# Patient Record
Sex: Female | Born: 1955
Health system: Southern US, Community
[De-identification: ages and names within clinical notes are randomized; demographics above are authoritative.]

## PROBLEM LIST (undated history)

## (undated) HISTORY — PX: NO PAST SURGERIES: SHX2092

---

## 2005-08-11 ENCOUNTER — Emergency Department (HOSPITAL_COMMUNITY): Admission: EM | Admit: 2005-08-11 | Discharge: 2005-08-11 | Payer: Self-pay | Admitting: Family Medicine

## 2007-07-17 ENCOUNTER — Encounter
Admission: RE | Admit: 2007-07-17 | Discharge: 2007-07-17 | Payer: Self-pay | Admitting: Physical Medicine and Rehabilitation

## 2012-04-27 ENCOUNTER — Encounter (HOSPITAL_COMMUNITY): Payer: Self-pay | Admitting: Emergency Medicine

## 2012-04-27 ENCOUNTER — Emergency Department (HOSPITAL_COMMUNITY)
Admission: EM | Admit: 2012-04-27 | Discharge: 2012-04-27 | Disposition: A | Discharge status: Home / Self Care | Payer: PRIVATE HEALTH INSURANCE | Source: Home / Self Care

## 2012-04-27 DIAGNOSIS — M6580 Other synovitis and tenosynovitis, unspecified site: Secondary | ICD-10-CM

## 2012-04-27 DIAGNOSIS — M76891 Other specified enthesopathies of right lower limb, excluding foot: Secondary | ICD-10-CM

## 2012-04-27 NOTE — ED Provider Notes (Signed)
History     CSN: [id number]  Arrival date & time 04/27/12  1114   None     Chief Complaint  Patient presents with  . Knee Pain    right knee pain x 1 wk. denies injury.     (Consider location/radiation/quality/duration/timing/severity/associated sxs/prior treatment) HPI Comments: 56 year old lady is accompanied by her daughter and presents with a complaint of right knee pain for one week. There is no history of trauma or known injury. The pain is located primarily in the anteromedial aspect of the left knee. It has progressed over the past week. There is mild puffiness in this area. There is no pain or tenderness to the bony prominences of the knee. And she is able to ambulate with full weightbearing and demonstrate full range of motion. Denies other injuries. Denies history of pain in the knee or other joints. She states the pain is primarily felt after sitting for a period of time and then standing, the knee is stiff and painful but after walking a few steps it gets better. She describes her job as one that she has to stand for several hours during the day on a daily basis.   History reviewed. No pertinent past medical history.  History reviewed. No pertinent past surgical history.  History reviewed. No pertinent family history.  History  Substance Use Topics  . Smoking status: Never Smoker   . Smokeless tobacco: Not on file  . Alcohol Use: No    OB History    Grav Para Term Preterm Abortions TAB SAB Ect Mult Living                  Review of Systems  Constitutional: Negative for fever, chills and activity change.  HENT: Negative.   Respiratory: Negative.   Cardiovascular: Negative.   Musculoskeletal:       As per HPI  Skin: Negative for color change, pallor and rash.  Neurological: Negative.     Allergies  Review of patient's allergies indicates no known allergies.  Home Medications  No current outpatient prescriptions on file.  BP 121/80  Pulse 71  Temp  98.4 F (36.9 C) (Oral)  Resp 16  SpO2 100%  Physical Exam  Nursing note and vitals reviewed. Constitutional: She is oriented to person, place, and time. She appears well-developed and well-nourished. No distress.  HENT:  Head: Normocephalic and atraumatic.  Eyes: EOM are normal. Pupils are equal, round, and reactive to light.  Neck: Normal range of motion. Neck supple.  Musculoskeletal:       Mild tenderness in the medial aspect of the right knee. There is no deformity or bony tenderness. Tenderness to the tendons in the medial aspect is present. No discoloration. She is full, complete range of motion. Negative drawer. Negative varus and negative valgus. No laxity is appreciated. Distal neurovascular motor sensory is intact.  Lymphadenopathy:    She has no cervical adenopathy.  Neurological: She is alert and oriented to person, place, and time. No cranial nerve deficit.  Skin: Skin is warm and dry.  Psychiatric: She has a normal mood and affect.    ED Course  Procedures (including critical care time)  Labs Reviewed - No data to display No results found.   1. Tendinitis of right knee       MDM  I suspect prolonged standing is contributing to the discomfort in her right knee. There is no history of trauma and no evidence of internal knee derangement or injury. We will  apply a short knee immobilizer for her to wear primarily at work. Since she has been applying heat and that helps I would recommend that while working she applied a ThermaCare wraps under the knee immobilizer. We also discussed methods of rehabilitation of the knee with sitting and performing extension and flexion exercises and turning the ankle inward and outward and other such maneuvers. She may also take Aleve or Advil one or the other of her choice for discomfort. For any worsening new symptoms problems or followup with your doctor or may return.         Hayden Rasmussen, NP 04/27/12 1210

## 2012-04-27 NOTE — ED Notes (Signed)
Pt c/o right knee pain x 1 wk. Pt denies injury. Pain is a constant pain that varies.  Pt has used massage oil and heating pad along with aleve with no relief in symptoms.

## 2012-04-29 NOTE — ED Provider Notes (Signed)
Medical screening examination/treatment/procedure(s) were performed by resident physician or non-physician practitioner and as supervising physician I was immediately available for consultation/collaboration.   Barkley Bruns MD.    Linna Hoff, MD 04/29/12 2053

## 2014-03-13 ENCOUNTER — Encounter (HOSPITAL_COMMUNITY): Payer: Self-pay | Admitting: Emergency Medicine

## 2014-03-13 ENCOUNTER — Emergency Department (INDEPENDENT_AMBULATORY_CARE_PROVIDER_SITE_OTHER)
Admission: EM | Admit: 2014-03-13 | Discharge: 2014-03-13 | Disposition: A | Discharge status: Home / Self Care | Payer: Self-pay | Source: Home / Self Care | Attending: Emergency Medicine | Admitting: Emergency Medicine

## 2014-03-13 DIAGNOSIS — S134XXA Sprain of ligaments of cervical spine, initial encounter: Secondary | ICD-10-CM

## 2014-03-13 DIAGNOSIS — S39012A Strain of muscle, fascia and tendon of lower back, initial encounter: Secondary | ICD-10-CM

## 2014-03-13 MED ORDER — MELOXICAM 7.5 MG PO TABS: 7.5000 mg | ORAL_TABLET | Freq: Every day | ORAL | Status: DC

## 2014-03-13 MED ORDER — CYCLOBENZAPRINE HCL 5 MG PO TABS: 5.0000 mg | ORAL_TABLET | Freq: Every evening | ORAL | Status: DC | PRN
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
## 2014-11-26 ENCOUNTER — Encounter (HOSPITAL_COMMUNITY): Payer: Self-pay | Admitting: *Deleted

## 2014-11-26 ENCOUNTER — Emergency Department (INDEPENDENT_AMBULATORY_CARE_PROVIDER_SITE_OTHER)
Admission: EM | Admit: 2014-11-26 | Discharge: 2014-11-26 | Disposition: A | Discharge status: Home / Self Care | Payer: PRIVATE HEALTH INSURANCE | Source: Home / Self Care | Attending: Family Medicine | Admitting: Family Medicine

## 2014-11-26 DIAGNOSIS — L250 Unspecified contact dermatitis due to cosmetics: Secondary | ICD-10-CM

## 2014-11-26 MED ORDER — FLUTICASONE PROPIONATE 0.05 % EX CREA: TOPICAL_CREAM | Freq: Two times a day (BID) | CUTANEOUS | Status: DC

## 2014-11-26 MED ORDER — TRIAMCINOLONE ACETONIDE 40 MG/ML IJ SUSP
40.0000 mg | Freq: Once | INTRAMUSCULAR | Status: AC
  Administered 2014-11-26: 40 mg via INTRAMUSCULAR

## 2014-11-26 MED ORDER — TRIAMCINOLONE ACETONIDE 40 MG/ML IJ SUSP
INTRAMUSCULAR | Status: AC
  Filled 2014-11-26: qty 1

## 2014-11-26 NOTE — ED Provider Notes (Signed)
CSN: [id number]     Arrival date & time 11/26/14  1523 History   First MD Initiated Contact with Patient 11/26/14 1726     Chief Complaint  Patient presents with  . Rash   (Consider location/radiation/quality/duration/timing/severity/associated sxs/prior Treatment) Patient is a 59 y.o. female presenting with rash. The history is provided by the patient.  Rash Location:  Face and shoulder/arm Facial rash location:  Forehead, L cheek and chin Shoulder/arm rash location:  R forearm Quality: itchiness   Severity:  Mild Onset quality:  Sudden Duration:  3 days Progression:  Unchanged Chronicity:  New Context comment:  Unknown exposure Relieved by:  None tried Worsened by:  Nothing tried   History reviewed. No pertinent past medical history. History reviewed. No pertinent past surgical history. History reviewed. No pertinent family history. History  Substance Use Topics  . Smoking status: Never Smoker   . Smokeless tobacco: Not on file  . Alcohol Use: No   OB History    No data available     Review of Systems  Constitutional: Negative.   Skin: Positive for rash.    Allergies  Review of patient's allergies indicates no known allergies.  Home Medications   Prior to Admission medications   Medication Sig Start Date End Date Taking? Authorizing Provider  cyclobenzaprine (FLEXERIL) 5 MG tablet Take 1 tablet (5 mg total) by mouth at bedtime as needed for muscle spasms. 03/13/14   Charm Rings, MD  fluticasone (CUTIVATE) 0.05 % cream Apply topically 2 (two) times daily. 11/26/14   Linna Hoff, MD  meloxicam (MOBIC) 7.5 MG tablet Take 1 tablet (7.5 mg total) by mouth daily. 03/13/14   Charm Rings, MD   BP 114/72 mmHg  Pulse 66  Temp(Src) 98 F (36.7 C) (Oral)  Resp 12  SpO2 100% Physical Exam  Constitutional: She is oriented to person, place, and time. She appears well-developed and well-nourished. No distress.  Pulmonary/Chest: Effort normal and breath sounds normal.   Lymphadenopathy:    She has no cervical adenopathy.  Neurological: She is alert and oriented to person, place, and time.  Skin: Skin is warm and dry. Rash noted. There is erythema.  Patchy erythema pruritic dermatitis as noted, nontender, no sign of infection  Nursing note and vitals reviewed.   ED Course  Procedures (including critical care time) Labs Review Labs Reviewed - No data to display  Imaging Review No results found.   MDM   1. Contact dermatitis due to cosmetics        Linna Hoff, MD 11/26/14 1755

## 2014-11-26 NOTE — ED Notes (Signed)
Rash    On  Face  And  Arms           denys  Any   specefic   Known  Causative    Agents   Worse  On face

## 2019-02-19 ENCOUNTER — Other Ambulatory Visit: Payer: Self-pay

## 2019-02-19 ENCOUNTER — Ambulatory Visit: Payer: Commercial Managed Care - PPO | Admitting: Family Medicine

## 2019-02-19 ENCOUNTER — Encounter: Payer: Self-pay | Admitting: Family Medicine

## 2019-02-19 VITALS — BP 112/64 | HR 72 | Temp 97.7°F | Ht 59.0 in | Wt 119.8 lb

## 2019-02-19 DIAGNOSIS — Z13220 Encounter for screening for lipoid disorders: Secondary | ICD-10-CM | POA: Diagnosis not present

## 2019-02-19 DIAGNOSIS — Z0000 Encounter for general adult medical examination without abnormal findings: Secondary | ICD-10-CM

## 2019-02-19 DIAGNOSIS — Z1211 Encounter for screening for malignant neoplasm of colon: Secondary | ICD-10-CM | POA: Diagnosis not present

## 2019-02-19 DIAGNOSIS — R002 Palpitations: Secondary | ICD-10-CM

## 2019-02-19 DIAGNOSIS — Z1231 Encounter for screening mammogram for malignant neoplasm of breast: Secondary | ICD-10-CM

## 2019-02-19 DIAGNOSIS — Z1159 Encounter for screening for other viral diseases: Secondary | ICD-10-CM

## 2019-02-19 DIAGNOSIS — M25561 Pain in right knee: Secondary | ICD-10-CM

## 2019-02-19 DIAGNOSIS — Z114 Encounter for screening for human immunodeficiency virus [HIV]: Secondary | ICD-10-CM

## 2019-02-19 DIAGNOSIS — Z124 Encounter for screening for malignant neoplasm of cervix: Secondary | ICD-10-CM

## 2019-02-19 NOTE — Patient Instructions (Addendum)
For your knee, always wear supportive shoes Can take ibuprofen 2-3 tablets every 8 hours Consider wearing a slip on knee brace while standing for prolonged periods of time If no improvement, can follow up with Dr. Lorelei Pont (sports medicine in this office) You will get a call about mammogram and colonoscopy  Health Maintenance for Postmenopausal Women Menopause is a normal process in which your ability to get pregnant comes to an end. This process happens slowly over many months or years, usually between the ages of 20 and 41. Menopause is complete when you have missed your menstrual periods for 12 months. It is important to talk with your health care provider about some of the most common conditions that affect women after menopause (postmenopausal women). These include heart disease, cancer, and bone loss (osteoporosis). Adopting a healthy lifestyle and getting preventive care can help to promote your health and wellness. The actions you take can also lower your chances of developing some of these common conditions. What should I know about menopause? During menopause, you may get a number of symptoms, such as:  Hot flashes. These can be moderate or severe.  Night sweats.  Decrease in sex drive.  Mood swings.  Headaches.  Tiredness.  Irritability.  Memory problems.  Insomnia. Choosing to treat or not to treat these symptoms is a decision that you make with your health care provider. Do I need hormone replacement therapy?  Hormone replacement therapy is effective in treating symptoms that are caused by menopause, such as hot flashes and night sweats.  Hormone replacement carries certain risks, especially as you become older. If you are thinking about using estrogen or estrogen with progestin, discuss the benefits and risks with your health care provider. What is my risk for heart disease and stroke? The risk of heart disease, heart attack, and stroke increases as you age. One of  the causes may be a change in the body's hormones during menopause. This can affect how your body uses dietary fats, triglycerides, and cholesterol. Heart attack and stroke are medical emergencies. There are many things that you can do to help prevent heart disease and stroke. Watch your blood pressure  High blood pressure causes heart disease and increases the risk of stroke. This is more likely to develop in people who have high blood pressure readings, are of African descent, or are overweight.  Have your blood pressure checked: ? Every 3-5 years if you are 35-53 years of age. ? Every year if you are 81 years old or older. Eat a healthy diet   Eat a diet that includes plenty of vegetables, fruits, low-fat dairy products, and lean protein.  Do not eat a lot of foods that are high in solid fats, added sugars, or sodium. Get regular exercise Get regular exercise. This is one of the most important things you can do for your health. Most adults should:  Try to exercise for at least 150 minutes each week. The exercise should increase your heart rate and make you sweat (moderate-intensity exercise).  Try to do strengthening exercises at least twice each week. Do these in addition to the moderate-intensity exercise.  Spend less time sitting. Even light physical activity can be beneficial. Other tips  Work with your health care provider to achieve or maintain a healthy weight.  Do not use any products that contain nicotine or tobacco, such as cigarettes, e-cigarettes, and chewing tobacco. If you need help quitting, ask your health care provider.  Know your numbers. Ask your  health care provider to check your cholesterol and your blood sugar (glucose). Continue to have your blood tested as directed by your health care provider. Do I need screening for cancer? Depending on your health history and family history, you may need to have cancer screening at different stages of your life. This may  include screening for:  Breast cancer.  Cervical cancer.  Lung cancer.  Colorectal cancer. What is my risk for osteoporosis? After menopause, you may be at increased risk for osteoporosis. Osteoporosis is a condition in which bone destruction happens more quickly than new bone creation. To help prevent osteoporosis or the bone fractures that can happen because of osteoporosis, you may take the following actions:  If you are 75-37 years old, get at least 1,000 mg of calcium and at least 600 mg of vitamin D per day.  If you are older than age 58 but younger than age 65, get at least 1,200 mg of calcium and at least 600 mg of vitamin D per day.  If you are older than age 16, get at least 1,200 mg of calcium and at least 800 mg of vitamin D per day. Smoking and drinking excessive alcohol increase the risk of osteoporosis. Eat foods that are rich in calcium and vitamin D, and do weight-bearing exercises several times each week as directed by your health care provider. How does menopause affect my mental health? Depression may occur at any age, but it is more common as you become older. Common symptoms of depression include:  Low or sad mood.  Changes in sleep patterns.  Changes in appetite or eating patterns.  Feeling an overall lack of motivation or enjoyment of activities that you previously enjoyed.  Frequent crying spells. Talk with your health care provider if you think that you are experiencing depression. General instructions See your health care provider for regular wellness exams and vaccines. This may include:  Scheduling regular health, dental, and eye exams.  Getting and maintaining your vaccines. These include: ? Influenza vaccine. Get this vaccine each year before the flu season begins. ? Pneumonia vaccine. ? Shingles vaccine. ? Tetanus, diphtheria, and pertussis (Tdap) booster vaccine. Your health care provider may also recommend other immunizations. Tell your  health care provider if you have ever been abused or do not feel safe at home. Summary  Menopause is a normal process in which your ability to get pregnant comes to an end.  This condition causes hot flashes, night sweats, decreased interest in sex, mood swings, headaches, or lack of sleep.  Treatment for this condition may include hormone replacement therapy.  Take actions to keep yourself healthy, including exercising regularly, eating a healthy diet, watching your weight, and checking your blood pressure and blood sugar levels.  Get screened for cancer and depression. Make sure that you are up to date with all your vaccines. This information is not intended to replace advice given to you by your health care provider. Make sure you discuss any questions you have with your health care provider. Document Released: 06/09/2005 Document Revised: 04/10/2018 Document Reviewed: 04/10/2018 Elsevier Patient Education  2020 [employer].

## 2019-02-19 NOTE — Progress Notes (Signed)
New Patient Office Visit  Subjective:  Patient ID: Diana Livingston, female    DOB: 1956/01/17  Age: 64 y.o. MRN: [id number]  CC:  Chief Complaint  Patient presents with  . New Patient (Initial Visit)    Right knee pain. No previous PCP    HPI Diana Livingston presents to establish primary healthcare provider. 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 Pt reports palpitations occasionally. They last 15-20 minutes and go away on its own. Pt noticed some palpitations when she startled.   Pt reports SOB when she lays down. She sleeps with one pillow.   Pt reports pain in her right leg. It goes from knee down to her ankle. Pt describes the pain as constantly achy. It can go up to 10/10. Pt tried to take Tylenol and apply icyhot packs with some relief. Pain is not better or worse during the day or night.    Last CPE-none Mammogram-referred today Pap smear- today Colonoscopy-referred today Tdap- Flu-2020 Dental-as needed Eye-as needed Exercise-walking   No past medical history on file.  No past surgical history on file.  No family history on file.  Social History   Socioeconomic History  . Marital status: Married    Spouse name: Not on file  . Number of children: Not on file  . Years of education: Not on file  . Highest education level: Not on file  Occupational History  . Not on file  Social Needs  . Financial resource strain: Not on file  . Food insecurity    Worry: Not on file    Inability: Not on file  . Transportation needs    Medical: Not on file    Non-medical: Not on file  Tobacco Use  . Smoking status: Never Smoker  Substance and Sexual Activity  . Alcohol use: No  . Drug use: Not on file  . Sexual activity: Never  Lifestyle  . Physical activity    Days per week: Not on file    Minutes per session: Not on file  . Stress: Not on file  Relationships  . Social Musician on phone: Not on file    Gets together: Not on file    Attends religious  service: Not on file    Active member of club or organization: Not on file    Attends meetings of clubs or organizations: Not on file    Relationship status: Not on file  . Intimate partner violence    Fear of current or ex partner: Not on file    Emotionally abused: Not on file    Physically abused: Not on file    Forced sexual activity: Not on file  Other Topics Concern  . Not on file  Social History Narrative  . Not on file    ROS Review of Systems  Constitutional: Negative for appetite change, fatigue and fever.  HENT: Negative for ear pain, rhinorrhea, sinus pain and sore throat.   Eyes: Negative for pain.  Respiratory: Positive for shortness of breath. Negative for cough, chest tightness and wheezing.   Cardiovascular: Negative for chest pain and leg swelling.  Gastrointestinal: Negative for abdominal distention, abdominal pain, blood in stool, constipation, diarrhea, nausea and vomiting.  Genitourinary: Negative for difficulty urinating, dysuria, hematuria and urgency.  Musculoskeletal: Positive for arthralgias.       Right knee pain  Skin: Negative for color change, rash and wound.  Neurological: Negative for dizziness, weakness and headaches.  BP 112/64 (BP Location: Left Arm, Patient Position: Sitting, Cuff Size: Normal)   Pulse 72   Temp 97.7 F (36.5 C) (Temporal)   Ht 4\' 11"  (1.499 m)   Wt 54.3 kg   SpO2 98%   BMI 24.20 kg/m  Objective:   Today's Vitals: There were no vitals taken for this visit.  Physical Exam Exam conducted with a chaperone present (pt's daughter in a room and Deliah Goody., NP).  Constitutional:      General: She is not in acute distress.    Appearance: Normal appearance. She is not ill-appearing.  HENT:     Head: Normocephalic and atraumatic.     Right Ear: Tympanic membrane, ear canal and external ear normal.     Left Ear: Tympanic membrane, ear canal and external ear normal.     Nose: Nose normal. No congestion or rhinorrhea.      Mouth/Throat:     Mouth: Mucous membranes are moist.     Pharynx: Oropharynx is clear.  Eyes:     General: No scleral icterus.       Right eye: No discharge.        Left eye: No discharge.     Extraocular Movements: Extraocular movements intact.     Conjunctiva/sclera: Conjunctivae normal.     Pupils: Pupils are equal, round, and reactive to light.  Neck:     Musculoskeletal: Normal range of motion.  Cardiovascular:     Rate and Rhythm: Normal rate and regular rhythm.     Pulses: Normal pulses.     Heart sounds: Normal heart sounds. No murmur. No friction rub. No gallop.   Pulmonary:     Effort: Pulmonary effort is normal.     Breath sounds: Normal breath sounds. No wheezing, rhonchi or rales.  Chest:     Breasts:        Right: Normal. No swelling, bleeding, inverted nipple, mass, nipple discharge, skin change or tenderness.        Left: Normal. No swelling, bleeding, inverted nipple, mass, nipple discharge, skin change or tenderness.  Abdominal:     General: Bowel sounds are normal. There is no distension.     Palpations: Abdomen is soft.     Tenderness: There is no abdominal tenderness.     Hernia: There is no hernia in the left inguinal area or right inguinal area.  Genitourinary:    General: Normal vulva.     Pubic Area: No rash or pubic lice.      Labia:        Right: No rash, tenderness, lesion or injury.        Left: No rash, tenderness, lesion or injury.      Urethra: No prolapse or urethral swelling.     Vagina: No vaginal discharge.     Rectum: Normal.  Musculoskeletal:        General: Tenderness present.     Right lower leg: No edema.     Left lower leg: No edema.     Comments: Right knee pain  Skin:    General: Skin is warm.  Neurological:     General: No focal deficit present.     Mental Status: She is alert and oriented to person, place, and time. Mental status is at baseline.  Psychiatric:        Mood and Affect: Mood normal.        Behavior: Behavior  normal.        Thought Content: Thought content  normal.     Assessment & Plan:  1. Encounter for screening mammogram for malignant neoplasm of breast - MM 3D SCREEN BREAST BILATERAL; Future  2. Palpitations Normal EKG,  - EKG 12-Lead - TSH - CBC with Differential - Comprehensive metabolic panel  3. Screening for colon cancer - Ambulatory referral to Gastroenterology  4. Encounter for hepatitis C screening test for low risk patient - Hepatitis C antibody  5. Screening for HIV without presence of risk factors - HIV Antibody (routine testing w rflx)  6. Screening for lipid disorders - Lipid Panel  7.Right knee pain, unspecified Pt advised to take Ibuprofen instead of tylenol, wear supportive shoes during long work hours and knee brace for support. Pt advised to see Dr. Dallas Schimkeopeland if pain does not get better.  Problem List Items Addressed This Visit    None      Outpatient Encounter Medications as of 02/19/2019  Medication Sig  . cyclobenzaprine (FLEXERIL) 5 MG tablet Take 1 tablet (5 mg total) by mouth at bedtime as needed for muscle spasms.  . fluticasone (CUTIVATE) 0.05 % cream Apply topically 2 (two) times daily.  . meloxicam (MOBIC) 7.5 MG tablet Take 1 tablet (7.5 mg total) by mouth daily.   No facility-administered encounter medications on file as of 02/19/2019.     Follow-up: No follow-ups on file.   Valentina Gulga N Teela Narducci, RN

## 2019-02-19 NOTE — Progress Notes (Signed)
Subjective:    Patient ID: Diana Livingston, female    DOB: 1955-08-30, 63 y.o.   MRN: [id number]  HPI Chief Complaint  Patient presents with  . New Patient (Initial Visit)    Right knee pain. No previous PCP   This is a 63 yo female who presents today to establish care. She is accompanied by her daughter who helps with translation and her husband who is also establishing care. She has 4 children and enjoys spending time with her grandchildren. 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    Last CPE- several years Mammo- many years ago while in [place] Pap- unknown last Colonoscopy- never, agrees to referral Tdap- unsure Flu- today Eye- this year Dental- regular Exercise- walks most days  Right knee pain- constant, for several months. Pain from knee down to ankle with walking and going up/down steps. Has taken Tylenol with a little help. Stands for work. Wears supportive shoes.   Palpitations- has 1-2 times per week, no SOB, not with walking, no chest pain. Lasts 15 minutes, goes away with relaxing. Never wakes her from sleep.   No past medical history on file. Past Surgical History:  Procedure Laterality Date  . NO PAST SURGERIES     History reviewed. No pertinent family history. Social History   Tobacco Use  . Smoking status: Never Smoker  . Smokeless tobacco: Never Used  Substance Use Topics  . Alcohol use: No  . Drug use: Never      Review of Systems  Constitutional: Negative.   HENT: Negative.   Eyes: Negative.   Respiratory: Negative.   Cardiovascular: Positive for palpitations. Negative for chest pain and leg swelling.  Gastrointestinal: Negative.   Endocrine: Negative.   Genitourinary: Negative.   Musculoskeletal: Positive for back pain (occasional with standing).       Right knee pain. See HPI.   Skin: Negative.   Allergic/Immunologic: Negative.   Neurological: Negative.   Hematological: Negative.    Psychiatric/Behavioral: Negative for sleep disturbance. The patient is not nervous/anxious.         Objective:   Physical Exam Vitals signs reviewed. Exam conducted with a chaperone present Kathe Becton, DNP student).  Constitutional:      General: She is not in acute distress.    Appearance: Normal appearance. She is normal weight. She is not ill-appearing, toxic-appearing or diaphoretic.  HENT:     Head: Normocephalic and atraumatic.     Right Ear: Tympanic membrane, ear canal and external ear normal.     Left Ear: Tympanic membrane, ear canal and external ear normal.     Nose: Nose normal.     Mouth/Throat:     Mouth: Mucous membranes are moist.     Pharynx: Oropharynx is clear.  Eyes:     Conjunctiva/sclera: Conjunctivae normal.  Neck:     Musculoskeletal: Normal range of motion and neck supple. No neck rigidity or muscular tenderness.  Cardiovascular:     Rate and Rhythm: Normal rate.     Heart sounds: Normal heart sounds.  Pulmonary:     Effort: Pulmonary effort is normal.     Breath sounds: Normal breath sounds.  Abdominal:     General: Abdomen is flat. There is no distension.     Palpations: Abdomen is soft. There is no mass.     Tenderness: There is no abdominal tenderness. There is no guarding or rebound.     Hernia: No hernia  is present.  Genitourinary:    Exam position: Supine.     Labia:        Right: No rash, tenderness, lesion or injury.        Left: No rash, tenderness, lesion or injury.      Urethra: No prolapse, urethral pain, urethral swelling or urethral lesion.     Vagina: Normal.     Cervix: Normal.  Musculoskeletal:     Right knee: She exhibits normal range of motion, no swelling, no effusion, no erythema and normal alignment. Tenderness found. Medial joint line and lateral joint line tenderness noted.  Lymphadenopathy:     Cervical: No cervical adenopathy.  Skin:    General: Skin is warm and dry.  Neurological:     Mental Status: She is alert  and oriented to person, place, and time.  Psychiatric:        Mood and Affect: Mood normal.        Behavior: Behavior normal.        Thought Content: Thought content normal.        Judgment: Judgment normal.       BP 112/64 (BP Location: Left Arm, Patient Position: Sitting, Cuff Size: Normal)   Pulse 72   Temp 97.7 F (36.5 C) (Temporal)   Ht 4\' 11"  (1.499 m)   Wt 119 lb 12.8 oz (54.3 kg)   SpO2 98%   BMI 24.20 kg/m      Depression screen [facility] 2/9 02/19/2019  Decreased Interest 0  Down, Depressed, Hopeless 0  PHQ - 2 Score 0   EKG- rate 69, Normal sinus rhythm PR 166 QT 418 Assessment & Plan:  1. Annual physical exam - Discussed and encouraged healthy lifestyle choices- adequate sleep, regular exercise, stress management and healthy food choices.   2. Encounter for screening mammogram for malignant neoplasm of breast - MM 3D SCREEN BREAST BILATERAL; Future  3. Palpitations - normal EKG, will check labs, may need holter monitor - EKG 12-Lead - TSH - CBC with Differential - Comprehensive metabolic panel  4. Screening for colon cancer - Ambulatory referral to Gastroenterology  5. Encounter for hepatitis C screening test for low risk patient - Hepatitis C antibody  6. Screening for HIV without presence of risk factors - HIV Antibody (routine testing w rflx)  7. Screening for lipid disorders - Lipid Panel  8. Right knee pain, unspecified chronicity - will try conservative measures- low dose NSAIDs, otc brace, ice/heat, if no improvement, follow up with ortho/sports medicine  9. Screening for cervical cancer - Cytology - PAP([place])  - follow up to be determined by labs/symptoms  02/21/2019, FNP-BC  [facility], [facility]  02/20/2019 12:58 PM

## 2019-02-20 ENCOUNTER — Other Ambulatory Visit (HOSPITAL_COMMUNITY)
Admission: RE | Admit: 2019-02-20 | Discharge: 2019-02-20 | Disposition: A | Discharge status: Home / Self Care | Payer: Commercial Managed Care - PPO | Source: Ambulatory Visit | Attending: Family Medicine | Admitting: Family Medicine

## 2019-02-20 ENCOUNTER — Encounter: Payer: Self-pay | Admitting: Family Medicine

## 2019-02-20 DIAGNOSIS — Z124 Encounter for screening for malignant neoplasm of cervix: Secondary | ICD-10-CM | POA: Insufficient documentation

## 2019-02-20 LAB — COMPREHENSIVE METABOLIC PANEL
ALT: 15 U/L (ref 0–35)
AST: 24 U/L (ref 0–37)
Albumin: 4.3 g/dL (ref 3.5–5.2)
Alkaline Phosphatase: 78 U/L (ref 39–117)
BUN: 14 mg/dL (ref 6–23)
CO2: 27 mEq/L (ref 19–32)
Calcium: 9.3 mg/dL (ref 8.4–10.5)
Chloride: 106 mEq/L (ref 96–112)
Creatinine, Ser: 0.71 mg/dL (ref 0.40–1.20)
GFR: 83.41 mL/min (ref >60.00)
Glucose, Bld: 88 mg/dL (ref 70–99)
Potassium: 4.4 mEq/L (ref 3.5–5.1)
Sodium: 141 mEq/L (ref 135–145)
Total Bilirubin: 0.4 mg/dL (ref 0.2–1.2)
Total Protein: 6.9 g/dL (ref 6.0–8.3)

## 2019-02-20 LAB — CBC WITH DIFFERENTIAL/PLATELET
Basophils Absolute: 0 10*3/uL (ref 0.0–0.1)
Basophils Relative: 0.3 % (ref 0.0–3.0)
Eosinophils Absolute: 0.6 10*3/uL (ref 0.0–0.7)
Eosinophils Relative: 8.6 % — ABNORMAL HIGH (ref 0.0–5.0)
HCT: 35.2 % — ABNORMAL LOW (ref 36.0–46.0)
Hemoglobin: 11 g/dL — ABNORMAL LOW (ref 12.0–15.0)
Lymphocytes Relative: 27.2 % (ref 12.0–46.0)
Lymphs Abs: 2 10*3/uL (ref 0.7–4.0)
MCHC: 31.2 g/dL (ref 30.0–36.0)
MCV: 76.4 fl — ABNORMAL LOW (ref 78.0–100.0)
Monocytes Absolute: 0.6 10*3/uL (ref 0.1–1.0)
Monocytes Relative: 8.9 % (ref 3.0–12.0)
Neutro Abs: 4 10*3/uL (ref 1.4–7.7)
Neutrophils Relative %: 55 % (ref 43.0–77.0)
Platelets: 209 10*3/uL (ref 150.0–400.0)
RBC: 4.6 Mil/uL (ref 3.87–5.11)
RDW: 14.9 % (ref 11.5–15.5)
WBC: 7.3 10*3/uL (ref 4.0–10.5)

## 2019-02-20 LAB — LIPID PANEL
Cholesterol: 202 mg/dL — ABNORMAL HIGH (ref 0–200)
HDL: 56.8 mg/dL (ref >39.00)
LDL Cholesterol: 107 mg/dL — ABNORMAL HIGH (ref 0–99)
NonHDL: 145.08
Total CHOL/HDL Ratio: 4
Triglycerides: 192 mg/dL — ABNORMAL HIGH (ref 0.0–149.0)
VLDL: 38.4 mg/dL (ref 0.0–40.0)

## 2019-02-20 LAB — HIV ANTIBODY (ROUTINE TESTING W REFLEX): HIV 1&2 Ab, 4th Generation: NONREACTIVE

## 2019-02-20 LAB — HEPATITIS C ANTIBODY
Hepatitis C Ab: NONREACTIVE
SIGNAL TO CUT-OFF: 0.02 (ref <1.00)

## 2019-02-20 LAB — TSH: TSH: 0.69 u[IU]/mL (ref 0.35–4.50)

## 2019-02-25 LAB — CYTOLOGY - PAP
Comment: NEGATIVE
Diagnosis: NEGATIVE
High risk HPV: NEGATIVE

## 2019-08-25 ENCOUNTER — Encounter: Payer: Self-pay | Admitting: Family Medicine

## 2019-08-25 ENCOUNTER — Ambulatory Visit: Payer: Commercial Managed Care - PPO | Admitting: Family Medicine

## 2019-08-25 ENCOUNTER — Other Ambulatory Visit: Payer: Self-pay

## 2019-08-25 VITALS — BP 116/72 | HR 87 | Temp 98.1°F | Ht 59.0 in | Wt 124.4 lb

## 2019-08-25 DIAGNOSIS — R300 Dysuria: Secondary | ICD-10-CM | POA: Diagnosis not present

## 2019-08-25 DIAGNOSIS — R350 Frequency of micturition: Secondary | ICD-10-CM

## 2019-08-25 DIAGNOSIS — H6992 Unspecified Eustachian tube disorder, left ear: Secondary | ICD-10-CM

## 2019-08-25 LAB — POC URINALSYSI DIPSTICK (AUTOMATED)
Bilirubin, UA: NEGATIVE
Blood, UA: NEGATIVE
Glucose, UA: NEGATIVE
Ketones, UA: NEGATIVE
Leukocytes, UA: NEGATIVE
Nitrite, UA: NEGATIVE
Protein, UA: NEGATIVE
Spec Grav, UA: 1.025 (ref 1.010–1.025)
Urobilinogen, UA: 0.2 E.U./dL
pH, UA: 6 (ref 5.0–8.0)

## 2019-08-25 MED ORDER — FLUTICASONE PROPIONATE 50 MCG/ACT NA SUSP: 2.0000 | Freq: Every day | NASAL | 6 refills | Status: DC

## 2019-08-25 NOTE — Progress Notes (Signed)
Subjective:    Patient ID: Diana Livingston, female    DOB: 1955-06-07, 64 y.o.   MRN: [id number]  HPI Chief Complaint  Patient presents with  . Ear Pain    x3 weeks   . Urinary Frequency    Since April 13 - lower abdominal pressure    This is a 64 yo female, accompanied by her daughter who is helping with translation, who presents today for above cc. She is accompanied by her daughter.   Urinary pressure/ pain. Drinks 3 bottles of water a day. Nocturia x 2-3. No nausea or vomiting, Occasional low back pain. Feels like she has to urinate but volume is low. No prior cystitis. Some vaginal itching, no discharge.   Left ear pain x 3 weeks. No runny nose or sore throat. No headaches. No fever. Not "pain" hearing engine type sound. No drainage.    Review of Systems Per HPI    Objective:   Physical Exam Vitals reviewed.  Constitutional:      Appearance: Normal appearance.  HENT:     Head: Normocephalic and atraumatic.     Right Ear: Tympanic membrane, ear canal and external ear normal.     Left Ear: Ear canal and external ear normal.     Ears:     Comments: Gross hearing intact. Left TM dull.  Eyes:     Conjunctiva/sclera: Conjunctivae normal.  Cardiovascular:     Rate and Rhythm: Normal rate and regular rhythm.     Heart sounds: Normal heart sounds.  Pulmonary:     Effort: Pulmonary effort is normal.     Breath sounds: Normal breath sounds.  Abdominal:     General: There is no distension.     Palpations: There is no mass.     Tenderness: There is no abdominal tenderness. There is no right CVA tenderness, left CVA tenderness, guarding or rebound.     Hernia: No hernia is present.  Musculoskeletal:     Cervical back: Normal range of motion and neck supple. No rigidity or tenderness.  Lymphadenopathy:     Cervical: No cervical adenopathy.  Skin:    General: Skin is warm and dry.  Neurological:     Mental Status: She is alert and oriented to person, place, and time.    Psychiatric:        Mood and Affect: Mood normal.        Behavior: Behavior normal.       BP 116/72 (BP Location: Left Arm, Patient Position: Sitting, Cuff Size: Normal)   Pulse 87   Temp 98.1 F (36.7 C) (Temporal)   Ht 4\' 11"  (1.499 m)   Wt 124 lb 6.4 oz (56.4 kg)   SpO2 98%   BMI 25.13 kg/m  Wt Readings from Last 3 Encounters:  08/25/19 124 lb 6.4 oz (56.4 kg)  02/19/19 119 lb 12.8 oz (54.3 kg)       Results for orders placed or performed in visit on 08/25/19  POCT Urinalysis Dipstick (Automated)  Result Value Ref Range   Color, UA Yellow    Clarity, UA Clear    Glucose, UA Negative Negative   Bilirubin, UA Neg    Ketones, UA Neg    Spec Grav, UA 1.025 1.010 - 1.025   Blood, UA Neg    pH, UA 6.0 5.0 - 8.0   Protein, UA Negative Negative   Urobilinogen, UA 0.2 0.2 or 1.0 E.U./dL   Nitrite, UA Neg    Leukocytes, UA Negative  Negative    Assessment & Plan:  1. Eustachian tube disorder, left - Provided written and verbal information regarding diagnosis and treatment. - will use nasal steroid spray, if no improvement in 1-2 weeks, ENT referral - fluticasone (FLONASE) 50 MCG/ACT nasal spray; Place 2 sprays into both nostrils daily.  Dispense: 16 g; Refill: 6  2. Urinary frequency - UA with SG 1.025, encouraged increased fluid intake, can take AZO prn, will send for culture - POCT Urinalysis Dipstick (Automated) - Urine Culture  3. Dysuria - per #2  This visit occurred during the SARS-CoV-2 public health emergency.  Safety protocols were in place, including screening questions prior to the visit, additional usage of staff PPE, and extensive cleaning of exam room while observing appropriate contact time as indicated for disinfecting solutions.    Clarene Reamer, FNP-BC  [facility], [facility]  08/25/2019 5:10 PM

## 2019-08-25 NOTE — Patient Instructions (Addendum)
Please call and schedule an appointment for screening mammogram. A referral is not needed.  [phone]  I will notify you of urine culture results  For symptoms, can pick up AZO over the counter (generic is fine)  If ear not better in 1-2 weeks or if worse, let me know and I will put in referral to ear, nose and throat

## 2019-08-27 LAB — URINE CULTURE
MICRO NUMBER:: 10405485
Result:: NO GROWTH
SPECIMEN QUALITY:: ADEQUATE

## 2019-12-29 ENCOUNTER — Telehealth: Payer: Self-pay

## 2019-12-29 ENCOUNTER — Other Ambulatory Visit: Payer: Self-pay | Admitting: Family Medicine

## 2019-12-29 DIAGNOSIS — H6992 Unspecified Eustachian tube disorder, left ear: Secondary | ICD-10-CM

## 2019-12-29 NOTE — Telephone Encounter (Signed)
Patient's daughter states they would prefer to go to [facility] location for ENT.  Thanks.

## 2019-12-29 NOTE — Telephone Encounter (Signed)
Saw Diana Sprang, NP in recent past (08/25/19) for eustachian tube dysfunction.  She continues to have difficulty and would like to have the referral mentioned at visit to the ENT.  Decreased hearing and sounds like "[name]" speaking in her ear.  (the sound on the [employer] telephone).    If referral can be placed or if have further info needed, please communicate all details to Diana Livingston the daughter:  [phone] Diana Livingston daughter

## 2019-12-29 NOTE — Telephone Encounter (Signed)
Please call patient's daughter and let her know that I have put in referral for her mother to see ear nose and throat specialist in [place].  It may take up to 2 weeks for her to get a call regarding an appointment.

## 2019-12-30 NOTE — Telephone Encounter (Signed)
Spoke to pt's daughter Misty [facility]). Informed her that Eunice Blase put in the referral.

## 2020-01-30 ENCOUNTER — Other Ambulatory Visit: Payer: Self-pay | Admitting: Otolaryngology

## 2020-01-30 DIAGNOSIS — H90A22 Sensorineural hearing loss, unilateral, left ear, with restricted hearing on the contralateral side: Secondary | ICD-10-CM

## 2020-02-17 ENCOUNTER — Ambulatory Visit: Admission: RE | Admit: 2020-02-17 | Payer: Commercial Managed Care - PPO | Source: Ambulatory Visit

## 2020-02-18 ENCOUNTER — Telehealth (INDEPENDENT_AMBULATORY_CARE_PROVIDER_SITE_OTHER): Payer: Commercial Managed Care - PPO | Admitting: Family Medicine

## 2020-02-18 ENCOUNTER — Encounter: Payer: Self-pay | Admitting: Family Medicine

## 2020-02-18 DIAGNOSIS — R5383 Other fatigue: Secondary | ICD-10-CM | POA: Diagnosis not present

## 2020-02-18 DIAGNOSIS — R6883 Chills (without fever): Secondary | ICD-10-CM | POA: Diagnosis not present

## 2020-02-18 DIAGNOSIS — R8290 Unspecified abnormal findings in urine: Secondary | ICD-10-CM

## 2020-02-18 DIAGNOSIS — R630 Anorexia: Secondary | ICD-10-CM

## 2020-02-18 NOTE — Progress Notes (Signed)
Virtual Visit via Video Note  I connected with Diana Livingston on 02/18/20 at  4:00 PM EDT by a video enabled telemedicine application and verified that I am speaking with the correct person using two identifiers.  Location: Patient: In her home Provider: [facility] Persons participating in virtual visit-patient, provider, patient's daughter   I discussed the limitations of evaluation and management by telemedicine and the availability of in person appointments. The patient expressed understanding and agreed to proceed.  History of Present Illness: Chief Complaint  Patient presents with  . Fatigue    tested negative for covid last week   . Generalized Body Aches    w loss of appetite    This is a 64 yo female who presents today for above cc. Her daughter is helping with interpretation.  According to the patient and her daughter, the patient has had fatigue and decreased appetite for 2 to 3 weeks.  [facility].  She had a negative Covid and flu tests.  At that time she had a fever of 102.2, vital signs and exam were otherwise unremarkable.  According to the patient and her daughter, the patient continues to have a slight fever with frequent chills and body aches.  She has a significant loss of appetite.  She typically has headaches and these are unchanged.  She does not have any lightheadedness or vertigo, pain in ears, she does have a pressure and whooshing sound in her ears that is being worked up by ENT.  She denies runny nose, sore throat, cough, shortness of breath, chest pain, diarrhea, constipation.  She has had some intermittent pain on the sides of her abdomen and some nausea with one episode of vomiting.  She has been drinking hot water and eating very little.  She denies any dysuria, hematuria, urinary frequency.  She reports that she is urinating regularly but there is some smell to her urine. In addition to COVID-19 testing at urgent  care, she has had 2 additional negative tests per the patient's daughter.   Observations/Objective: The patient is alert and conversive.  Visible skin is unremarkable.  Respirations are even and unlabored without increased work of breathing with conversation.  No audible wheeze or witnessed cough.  She looks tired.  No acute distress. There were no vitals taken for this visit. Wt Readings from Last 3 Encounters:  08/25/19 124 lb 6.4 oz (56.4 kg)  02/19/19 119 lb 12.8 oz (54.3 kg)  Urgent care weight 56.1 kg 02/09/2020 BP Readings from Last 3 Encounters:  08/25/19 116/72  02/19/19 112/64  11/26/14 114/72     Assessment and Plan: 1. Other fatigue -Unclear etiology, suspect viral illness, multiple recent negative Covid and negative flu test - CBC with Differential/Platelet; Future - Comprehensive metabolic panel; Future - Urinalysis with Culture, if indicated; Future - DG Chest 2 View; Future  2. Chills -Per #1 - CBC with Differential/Platelet; Future - Comprehensive metabolic panel; Future - Urinalysis with Culture, if indicated; Future - DG Chest 2 View; Future  3. Abnormal urine odor -Encouraged increased fluids and small, frequent amounts of bland foods - Urinalysis with Culture, if indicated; Future  4. Decreased appetite - CBC with Differential/Platelet; Future - Comprehensive metabolic panel; Future - Urinalysis with Culture, if indicated; Future - DG Chest 2 View; Future   Olean Ree, FNP-BC  [facility], [facility]  02/18/2020 5:02 PM   Follow Up Instructions:  I discussed the assessment and treatment plan with the patient. The patient was provided an opportunity to ask questions and all were answered. The patient agreed with the plan and demonstrated an understanding of the instructions.   The patient was advised to call back or seek an in-person evaluation if the symptoms worsen or if the condition fails to  improve as anticipated. Emi Belfast, FNP

## 2020-02-19 ENCOUNTER — Other Ambulatory Visit: Payer: Commercial Managed Care - PPO

## 2020-02-19 ENCOUNTER — Telehealth: Payer: Self-pay

## 2020-02-19 ENCOUNTER — Other Ambulatory Visit (INDEPENDENT_AMBULATORY_CARE_PROVIDER_SITE_OTHER): Payer: Commercial Managed Care - PPO

## 2020-02-19 ENCOUNTER — Telehealth: Payer: Self-pay | Admitting: Family Medicine

## 2020-02-19 ENCOUNTER — Ambulatory Visit (INDEPENDENT_AMBULATORY_CARE_PROVIDER_SITE_OTHER)
Admission: RE | Admit: 2020-02-19 | Discharge: 2020-02-19 | Disposition: A | Discharge status: Home / Self Care | Payer: Commercial Managed Care - PPO | Source: Ambulatory Visit | Attending: Family Medicine | Admitting: Family Medicine

## 2020-02-19 ENCOUNTER — Other Ambulatory Visit: Payer: Self-pay

## 2020-02-19 DIAGNOSIS — R6883 Chills (without fever): Secondary | ICD-10-CM

## 2020-02-19 DIAGNOSIS — R630 Anorexia: Secondary | ICD-10-CM

## 2020-02-19 DIAGNOSIS — R5383 Other fatigue: Secondary | ICD-10-CM | POA: Diagnosis not present

## 2020-02-19 DIAGNOSIS — R8290 Unspecified abnormal findings in urine: Secondary | ICD-10-CM

## 2020-02-19 DIAGNOSIS — N3090 Cystitis, unspecified without hematuria: Secondary | ICD-10-CM

## 2020-02-19 LAB — COMPREHENSIVE METABOLIC PANEL
ALT: 14 U/L (ref 0–35)
AST: 14 U/L (ref 0–37)
Albumin: 3.5 g/dL (ref 3.5–5.2)
Alkaline Phosphatase: 85 U/L (ref 39–117)
BUN: 18 mg/dL (ref 6–23)
CO2: 27 mEq/L (ref 19–32)
Calcium: 9.4 mg/dL (ref 8.4–10.5)
Chloride: 95 mEq/L — ABNORMAL LOW (ref 96–112)
Creatinine, Ser: 0.94 mg/dL (ref 0.40–1.20)
GFR: 64.8 mL/min (ref >60.00)
Glucose, Bld: 115 mg/dL — ABNORMAL HIGH (ref 70–99)
Potassium: 3.7 mEq/L (ref 3.5–5.1)
Sodium: 131 mEq/L — ABNORMAL LOW (ref 135–145)
Total Bilirubin: 0.6 mg/dL (ref 0.2–1.2)
Total Protein: 7.2 g/dL (ref 6.0–8.3)

## 2020-02-19 LAB — CBC WITH DIFFERENTIAL/PLATELET
Basophils Absolute: 0.1 10*3/uL (ref 0.0–0.1)
Basophils Relative: 0.6 % (ref 0.0–3.0)
Eosinophils Absolute: 0.2 10*3/uL (ref 0.0–0.7)
Eosinophils Relative: 0.9 % (ref 0.0–5.0)
HCT: 34.3 % — ABNORMAL LOW (ref 36.0–46.0)
Hemoglobin: 10.9 g/dL — ABNORMAL LOW (ref 12.0–15.0)
Lymphocytes Relative: 7.1 % — ABNORMAL LOW (ref 12.0–46.0)
Lymphs Abs: 1.2 10*3/uL (ref 0.7–4.0)
MCHC: 31.6 g/dL (ref 30.0–36.0)
MCV: 75 fl — ABNORMAL LOW (ref 78.0–100.0)
Monocytes Absolute: 1.4 10*3/uL — ABNORMAL HIGH (ref 0.1–1.0)
Monocytes Relative: 8.1 % (ref 3.0–12.0)
Neutro Abs: 14.4 10*3/uL — ABNORMAL HIGH (ref 1.4–7.7)
Neutrophils Relative %: 83.3 % — ABNORMAL HIGH (ref 43.0–77.0)
Platelets: 447 10*3/uL — ABNORMAL HIGH (ref 150.0–400.0)
RBC: 4.58 Mil/uL (ref 3.87–5.11)
RDW: 15.2 % (ref 11.5–15.5)
WBC: 17.2 10*3/uL — ABNORMAL HIGH (ref 4.0–10.5)

## 2020-02-19 LAB — URINALYSIS WITH CULTURE, IF INDICATED
Bilirubin Urine: NEGATIVE
Ketones, ur: NEGATIVE
Nitrite: NEGATIVE
Specific Gravity, Urine: 1.025 (ref 1.000–1.030)
Urine Glucose: NEGATIVE
Urobilinogen, UA: 0.2 (ref 0.0–1.0)
pH: 6 (ref 5.0–8.0)

## 2020-02-19 NOTE — Telephone Encounter (Signed)
Patient's weight obtained as requested when she came in for labs. Per Mills Koller, 112.6 lb.

## 2020-02-19 NOTE — Telephone Encounter (Signed)
They completed the Urine Micro but thinks there is not enough urine for the culture. They will send what they have but there is a chance the sample will be rejected.

## 2020-02-20 MED ORDER — SULFAMETHOXAZOLE-TRIMETHOPRIM 800-160 MG PO TABS: 1.0000 | ORAL_TABLET | Freq: Two times a day (BID) | ORAL | 0 refills | Status: DC

## 2020-02-20 NOTE — Addendum Note (Signed)
Addended by: Olean Ree B on: 02/20/2020 09:17 AM   Modules accepted: Orders

## 2020-02-20 NOTE — Telephone Encounter (Signed)
Noted  

## 2020-02-21 LAB — URINE CULTURE
MICRO NUMBER:: 11101546
SPECIMEN QUALITY:: ADEQUATE

## 2020-02-22 ENCOUNTER — Other Ambulatory Visit: Payer: Self-pay | Admitting: Family Medicine

## 2020-02-22 DIAGNOSIS — H6992 Unspecified Eustachian tube disorder, left ear: Secondary | ICD-10-CM

## 2020-02-25 ENCOUNTER — Other Ambulatory Visit: Payer: Self-pay

## 2020-02-25 ENCOUNTER — Ambulatory Visit (INDEPENDENT_AMBULATORY_CARE_PROVIDER_SITE_OTHER): Payer: Commercial Managed Care - PPO | Admitting: Family Medicine

## 2020-02-25 ENCOUNTER — Other Ambulatory Visit: Payer: Self-pay | Admitting: Internal Medicine

## 2020-02-25 ENCOUNTER — Ambulatory Visit
Admission: RE | Admit: 2020-02-25 | Discharge: 2020-02-25 | Disposition: A | Discharge status: Home / Self Care | Payer: Commercial Managed Care - PPO | Source: Ambulatory Visit | Attending: Family Medicine | Admitting: Family Medicine

## 2020-02-25 ENCOUNTER — Telehealth: Payer: Self-pay

## 2020-02-25 ENCOUNTER — Encounter: Payer: Self-pay | Admitting: Family Medicine

## 2020-02-25 ENCOUNTER — Telehealth: Payer: Self-pay | Admitting: Internal Medicine

## 2020-02-25 VITALS — BP 106/72 | HR 94 | Temp 98.1°F | Wt 111.0 lb

## 2020-02-25 DIAGNOSIS — R1013 Epigastric pain: Secondary | ICD-10-CM

## 2020-02-25 DIAGNOSIS — N3090 Cystitis, unspecified without hematuria: Secondary | ICD-10-CM | POA: Diagnosis not present

## 2020-02-25 DIAGNOSIS — R634 Abnormal weight loss: Secondary | ICD-10-CM | POA: Insufficient documentation

## 2020-02-25 DIAGNOSIS — R112 Nausea with vomiting, unspecified: Secondary | ICD-10-CM

## 2020-02-25 DIAGNOSIS — N12 Tubulo-interstitial nephritis, not specified as acute or chronic: Secondary | ICD-10-CM

## 2020-02-25 DIAGNOSIS — R1032 Left lower quadrant pain: Secondary | ICD-10-CM | POA: Insufficient documentation

## 2020-02-25 MED ORDER — IOHEXOL 300 MG/ML  SOLN
75.0000 mL | Freq: Once | INTRAMUSCULAR | Status: AC | PRN
  Administered 2020-02-25: 75 mL via INTRAVENOUS

## 2020-02-25 MED ORDER — OMEPRAZOLE 20 MG PO CPDR: 20.0000 mg | DELAYED_RELEASE_CAPSULE | Freq: Every day | ORAL | 1 refills | Status: DC

## 2020-02-25 MED ORDER — ONDANSETRON 8 MG PO TBDP: 8.0000 mg | ORAL_TABLET | Freq: Three times a day (TID) | ORAL | 0 refills | Status: DC | PRN

## 2020-02-25 MED ORDER — CIPROFLOXACIN HCL 500 MG PO TABS: 500.0000 mg | ORAL_TABLET | Freq: Two times a day (BID) | ORAL | 0 refills | Status: AC

## 2020-02-25 NOTE — Addendum Note (Signed)
Addended by: Olean Ree B on: 02/25/2020 05:00 PM   Modules accepted: Orders

## 2020-02-25 NOTE — Telephone Encounter (Signed)
IMPRESSION: There is an abnormal, somewhat striated appearance of the right kidney when compared to the left. This is highly suspicious for pyelonephritis and should be correlated with urinalysis.

## 2020-02-25 NOTE — Telephone Encounter (Signed)
Attempted to call patient's daughter. No answer, no voice mail. Mychart result message sent and prescription for Cipro 500 mg q12 x 7 days sent to patient's pharmacy.

## 2020-02-25 NOTE — Progress Notes (Signed)
Subjective:    Patient ID: Diana Livingston, female    DOB: 17-Jan-1956, 64 y.o.   MRN: [id number]  HPI Chief Complaint  Patient presents with  . Abdominal Pain    adb pain started friday in her chest and stomach left side   This is a 64 yo female who presents today with above cc. She is accompanied by her daughter, Clydie Braun. She was seen for a video visit 02/18/2020, had labs and urinalysis showed UTI, was sent in antibiotic. Urine culture showed e. Coli.   Has been losing weight over last month, symptoms started as regular "cold," has been tired and run down. Noticed after ENT visit, starting prednisone.   Intermittent sharp left sided pain and burning in chest. Started with antibiotic. Has been throwing up for several days, since starting antibiotic for UTI. Eating with antibiotic. Vomiting about 1 hour after taking antibiotic. Vomiting 2-3 times per day. Has finished antibiotic (last dose today), no burning, frequency, hematuria.   Prior to starting antibiotic, decreased appetite, fatigue for at least one month. No increased stressors.   Review of Systems Chills, no fever, no cough, no SOB, no blood/ dark bowel movements    Objective:   Physical Exam Vitals reviewed.  Constitutional:      General: She is not in acute distress.    Appearance: She is well-developed and normal weight. She is ill-appearing (looks fatigued). She is not toxic-appearing or diaphoretic.  HENT:     Head: Normocephalic and atraumatic.     Nose: Nose normal.     Mouth/Throat:     Mouth: Mucous membranes are moist.     Pharynx: Oropharynx is clear.  Eyes:     Conjunctiva/sclera: Conjunctivae normal.  Cardiovascular:     Rate and Rhythm: Normal rate and regular rhythm.     Heart sounds: Normal heart sounds.  Pulmonary:     Effort: Pulmonary effort is normal.     Breath sounds: Normal breath sounds.  Abdominal:     General: Abdomen is flat. Bowel sounds are normal. There is no distension.      Palpations: Abdomen is soft. There is no mass.     Tenderness: There is abdominal tenderness (llq). There is no guarding or rebound.     Hernia: No hernia is present.  Musculoskeletal:     Cervical back: Normal range of motion and neck supple.     Right lower leg: No edema.     Left lower leg: No edema.  Skin:    General: Skin is warm and dry.  Neurological:     General: No focal deficit present.     Mental Status: She is alert.  Psychiatric:        Mood and Affect: Mood normal.        Behavior: Behavior normal.        Thought Content: Thought content normal.        Judgment: Judgment normal.          BP 106/72   Pulse 94   Temp 98.1 F (36.7 C) (Temporal)   Wt 111 lb (50.3 kg)   SpO2 99%   BMI 22.42 kg/m  Wt Readings from Last 3 Encounters:  02/25/20 111 lb (50.3 kg)  08/25/19 124 lb 6.4 oz (56.4 kg)  02/19/19 119 lb 12.8 oz (54.3 kg)    Assessment & Plan:  1. Cystitis -  Need to make sure UTI cleared, she has had a lot of vomiting with antibiotic, so will  check culture - Urine Culture  2. Epigastric pain - unclear etiology, with weight loss, will get Ct - CT Abdomen Pelvis W Contrast; Future - omeprazole (PRILOSEC) 20 MG capsule; Take 1 capsule (20 mg total) by mouth daily.  Dispense: 30 capsule; Refill: 1  3. Left lower quadrant pain - CT Abdomen Pelvis W Contrast; Future  4. Unintentional weight loss - CT Abdomen Pelvis W Contrast; Future  5. Non-intractable vomiting with nausea, unspecified vomiting type - continue bland foods, adequate fluids - ondansetron (ZOFRAN-ODT) 8 MG disintegrating tablet; Take 1 tablet (8 mg total) by mouth every 8 (eight) hours as needed for nausea.  Dispense: 30 tablet; Refill: 0 - omeprazole (PRILOSEC) 20 MG capsule; Take 1 capsule (20 mg total) by mouth daily.  Dispense: 30 capsule; Refill: 1  This visit occurred during the SARS-CoV-2 public health emergency.  Safety protocols were in place, including screening questions  prior to the visit, additional usage of staff PPE, and extensive cleaning of exam room while observing appropriate contact time as indicated for disinfecting solutions.    Olean Ree, FNP-BC  [facility], [facility]  02/25/2020 11:55 AM

## 2020-02-25 NOTE — Telephone Encounter (Signed)
STAT CT called to me by [facility] RN at 7:47 pm.  Right pyelonephritis suggested. [facility] RN advised to contact Patient with results and advised to continue ciprofloxacin  .  If not able to tolerate meds due to vomiting, will be advised to go to ER. Advised [facility] RN to use interpretor to notify patient.

## 2020-02-25 NOTE — Patient Instructions (Signed)
Good to see you today  I have sent in an as needed medication for nausea and vomiting, ondansetron. It may cause constipation. I have also sent in an acid reducing medication, omeprazole, to take daily.   I am checking urine to see if still with bladder infection- takes 2 days to come back, I will send mychart message with results.

## 2020-02-26 LAB — URINE CULTURE
MICRO NUMBER:: 11125802
SPECIMEN QUALITY:: ADEQUATE

## 2020-02-26 NOTE — Telephone Encounter (Signed)
See note from Dr. Selena Batten.  I will defer to her note.  Thanks.

## 2020-02-26 NOTE — Telephone Encounter (Signed)
Sending note to Harlin Heys FNP who is out of office today and DR Para March who is in office today and Clydie Braun CMA.

## 2020-02-26 NOTE — Telephone Encounter (Signed)
Called daughter (ok per DPR) reviewed all information.  Denies any change in symptoms stated that pt started cipro last night reports no change or increase in symptoms. Reviews red words, if any change in symptoms or red words will go to ED.

## 2020-02-26 NOTE — Telephone Encounter (Signed)
[facility] Night - Client TELEPHONE ADVICE RECORD [employer] Patient Name: Diana Livingston Gender: Unknown DOB: 05/18/1955 Age: 64 Y 13 D Return Phone Number: [phone] (Primary) Address: City/State/Zip: [place] Client [facility] Night - Client Client Site [facility] - Night Physician Deboraha Sprang- NP Contact Type Call Who Is Calling Lab Lab Name [facility] Lab Phone Number [phone] Lab Tech Name [facility] Lab Reference Number N/A Chief Complaint Lab Result (Critical or Stat) Call Type Lab Send to RN Reason for Call Report lab results Initial Comment Dan with [facility] is calling in to report STAT CT results. Translation No Nurse Assessment Nurse: Teola Bradley, RN, Christina Date/Time (Eastern Time): 02/25/2020 7:35:16 PM Is there an on-call provider listed? ---Yes Please document the following items: Lab name Lab value (read back to lab to verify) Reference range for lab value Date and time blood was drawn Collect time of birth for bilirubin results ---CT abdomen/ pelvis 1.abnormal right kidney size in comparison to left maybe due to pyelonephritis and should be followed with a UA. 2. large amount of stool on colon, 3.mild wall thinking distal esophagus may be secondary to under distention or esophagitis Disp. Time Lamount Cohen Time) Disposition Final User 02/25/2020 7:43:40 PM Called On-Call Provider Teola Bradley, RN, Christina 02/25/2020 7:52:58 PM Attempt made - no message left Teola Bradley RN, Christina 02/25/2020 7:53:23 PM Attempt made - line busy Teola Bradley, RN, Christina 02/25/2020 8:22:53 PM Called On-Call Provider Teola Bradley, RN, Christina 02/25/2020 7:53:17 PM Clinical Call Yes Teola Bradley, RN, [facility] Paging DoctorName Phone DateTime Result/Outcome Message Type Notes Duncan Dull - MD [phone] 02/25/2020 7:43:40 PM Called On Call Provider - Reached Doctor Paged Duncan Dull - MD 02/25/2020 7:46:45 PM Spoke  with On Call - General Message Result On call notified of CT report , on call stated to call patient and let them know that they have a kidney infection and PLEASE NOTE: All timestamps contained within this report are represented as [place] Standard Time. CONFIDENTIALTY NOTICE: This fax transmission is intended only for the addressee. It contains information that is legally privileged, confidential or otherwise protected from use or disclosure. If you are not the intended recipient, you are strictly prohibited from reviewing, disclosing, copying using or disseminating any of this information or taking any action in reliance on or regarding this information. If you have received this fax in error, please notify us immediately by telephone so that we can arrange for its return to [place]. Phone: [phone], Toll-Free: [phone], Fax: [phone] Page: 2 of 2 Call Id: [id number] Paging DoctorName Phone DateTime Result/Outcome Message Type Notes that the antibiotics that she was started on in the office will help it but if she is not able to keep them down she will need to go to the ED Duncan Dull - MD [phone] 02/25/2020 8:22:53 PM Called On Call Provider - Reached Doctor Paged Duncan Dull - MD 02/25/2020 8:23:13 PM Spoke with On Call - General Message Result connected patient to on call

## 2020-02-26 NOTE — Telephone Encounter (Signed)
Routing to Clydie Braun CMA to call patient and check in to see how she is doing.   If her symptoms are not improving on Cipro or worsening symptoms advise ER.   Will need Chad phone interpretor for call

## 2020-02-26 NOTE — Telephone Encounter (Signed)
Noted  

## 2020-03-02 ENCOUNTER — Encounter: Payer: Self-pay | Admitting: Radiology

## 2020-03-02 ENCOUNTER — Emergency Department
Admission: EM | Admit: 2020-03-02 | Discharge: 2020-03-02 | Disposition: A | Discharge status: Home / Self Care | Payer: Commercial Managed Care - PPO | Attending: Emergency Medicine | Admitting: Emergency Medicine

## 2020-03-02 ENCOUNTER — Emergency Department: Payer: Commercial Managed Care - PPO

## 2020-03-02 ENCOUNTER — Other Ambulatory Visit: Payer: Self-pay

## 2020-03-02 DIAGNOSIS — R112 Nausea with vomiting, unspecified: Secondary | ICD-10-CM | POA: Diagnosis not present

## 2020-03-02 DIAGNOSIS — R1012 Left upper quadrant pain: Secondary | ICD-10-CM | POA: Diagnosis not present

## 2020-03-02 DIAGNOSIS — G8929 Other chronic pain: Secondary | ICD-10-CM | POA: Diagnosis not present

## 2020-03-02 DIAGNOSIS — R079 Chest pain, unspecified: Secondary | ICD-10-CM | POA: Insufficient documentation

## 2020-03-02 DIAGNOSIS — R109 Unspecified abdominal pain: Secondary | ICD-10-CM | POA: Diagnosis present

## 2020-03-02 LAB — CBC
HCT: 34.3 % — ABNORMAL LOW (ref 36.0–46.0)
Hemoglobin: 10.9 g/dL — ABNORMAL LOW (ref 12.0–15.0)
MCH: 23.6 pg — ABNORMAL LOW (ref 26.0–34.0)
MCHC: 31.8 g/dL (ref 30.0–36.0)
MCV: 74.4 fL — ABNORMAL LOW (ref 80.0–100.0)
Platelets: 349 10*3/uL (ref 150–400)
RBC: 4.61 MIL/uL (ref 3.87–5.11)
RDW: 14.8 % (ref 11.5–15.5)
WBC: 10.9 10*3/uL — ABNORMAL HIGH (ref 4.0–10.5)
nRBC: 0 % (ref 0.0–0.2)

## 2020-03-02 LAB — COMPREHENSIVE METABOLIC PANEL
ALT: 14 U/L (ref 0–44)
AST: 20 U/L (ref 15–41)
Albumin: 3.7 g/dL (ref 3.5–5.0)
Alkaline Phosphatase: 67 U/L (ref 38–126)
Anion gap: 10 (ref 5–15)
BUN: 12 mg/dL (ref 8–23)
CO2: 26 mmol/L (ref 22–32)
Calcium: 9.7 mg/dL (ref 8.9–10.3)
Chloride: 97 mmol/L — ABNORMAL LOW (ref 98–111)
Creatinine, Ser: 1.18 mg/dL — ABNORMAL HIGH (ref 0.44–1.00)
GFR, Estimated: 52 mL/min — ABNORMAL LOW (ref >60)
Glucose, Bld: 114 mg/dL — ABNORMAL HIGH (ref 70–99)
Potassium: 4.2 mmol/L (ref 3.5–5.1)
Sodium: 133 mmol/L — ABNORMAL LOW (ref 135–145)
Total Bilirubin: 0.7 mg/dL (ref 0.3–1.2)
Total Protein: 7.6 g/dL (ref 6.5–8.1)

## 2020-03-02 LAB — LACTIC ACID, PLASMA: Lactic Acid, Venous: 1.1 mmol/L (ref 0.5–1.9)

## 2020-03-02 LAB — FIBRIN DERIVATIVES D-DIMER (ARMC ONLY): Fibrin derivatives D-dimer (ARMC): 675.42 ng/mL (FEU) — ABNORMAL HIGH (ref 0.00–499.00)

## 2020-03-02 LAB — LIPASE, BLOOD: Lipase: 50 U/L (ref 11–51)

## 2020-03-02 LAB — TROPONIN I (HIGH SENSITIVITY): Troponin I (High Sensitivity): 11 ng/L (ref <18)

## 2020-03-02 MED ORDER — MORPHINE SULFATE (PF) 4 MG/ML IV SOLN
4.0000 mg | Freq: Once | INTRAVENOUS | Status: AC
  Administered 2020-03-02: 4 mg via INTRAVENOUS
  Filled 2020-03-02: qty 1

## 2020-03-02 MED ORDER — IOHEXOL 350 MG/ML SOLN
75.0000 mL | Freq: Once | INTRAVENOUS | Status: AC | PRN
  Administered 2020-03-02: 75 mL via INTRAVENOUS

## 2020-03-02 MED ORDER — ONDANSETRON HCL 4 MG/2ML IJ SOLN
4.0000 mg | Freq: Once | INTRAMUSCULAR | Status: AC
  Administered 2020-03-02: 4 mg via INTRAVENOUS
  Filled 2020-03-02: qty 2

## 2020-03-02 MED ORDER — LACTATED RINGERS IV BOLUS
1000.0000 mL | Freq: Once | INTRAVENOUS | Status: AC
  Administered 2020-03-02: 1000 mL via INTRAVENOUS

## 2020-03-02 NOTE — ED Notes (Signed)
Pt tolerated food and drink  

## 2020-03-02 NOTE — ED Triage Notes (Signed)
Pt had a ct scan a week ago for abd pain, pt cont to have left upper abd pain, pt was diagnosed with a kidney infection at that time, pt cont to take the antibiotics, pt had vomiting 2 days ago

## 2020-03-02 NOTE — Discharge Instructions (Signed)
As we discussed, your blood work and CT scans of chest and abdomen did not show a source of your pain.  Please follow-up with your primary care physician to discuss your continued symptoms.  If you develop any significantly worsening symptoms, particularly fevers or inability to drink/eat anything, please return to the ED

## 2020-03-02 NOTE — ED Provider Notes (Signed)
[facility] Emergency Department Provider Note ____________________________________________   First MD Initiated Contact with Patient 03/02/20 [phone]     (approximate)  I have reviewed the triage vital signs and the nursing notes.  HISTORY  Chief Complaint Abdominal Pain   HPI Diana Livingston is a 64 y.o. femalewho presents to the ED for evaluation of abdominal pain.   Chart review indicates PCP visit on 10/27 for epigastric pain in the setting of antibiotic treatment for UTI, subsequent CT scan with faint stranding around her right kidney and patient was diagnosed with pyelonephritis.  Patient was provided a 7-day course of ciprofloxacin.  Patient is Chad and son provides translating services at the bedside.  He provides additional history as he lives with her.  Patient takes no regular prescription medications, beyond her temporary course of ciprofloxacin.  They report no medical problems.  Patient presents to the ED with her son due to 1 month of symptoms.  Son indicates the first 2 weeks patient had generalized feeling unwell, nausea and vomiting and this is progressed to left-sided flank pain over the past 2 weeks.  They report the pain is constant, aching in the left side, and "comes in waves" of worsening doubles her over in pain.  The indicate poor p.o. intake and postprandial vomiting.  The indicate one bowel movement in the past 1 week.  Patient reports the pain radiates from her left flank up to her substernal chest.   They report compliance with ciprofloxacin, have 1 day left.  This has not improved the symptoms, and they may be worse.  Of note, CT scan demonstrated abnormal findings around the right kidney and her pain is on the left.  History reviewed. No pertinent past medical history.  There are no problems to display for this patient.   Past Surgical History:  Procedure Laterality Date  . NO PAST SURGERIES      Prior to Admission  medications   Medication Sig Start Date End Date Taking? Authorizing Provider  Multiple Vitamins-Minerals (CENTRUM SILVER 50+WOMEN) TABS Take 1 tablet by mouth daily.   Yes [provider]  omeprazole (PRILOSEC) 20 MG capsule Take 1 capsule (20 mg total) by mouth daily. 02/25/20  Yes Emi Belfast, FNP  ciprofloxacin (CIPRO) 500 MG tablet Take 1 tablet (500 mg total) by mouth 2 (two) times daily for 7 days. Patient not taking: Reported on 03/02/2020 02/25/20 03/03/20  Emi Belfast, FNP  ondansetron (ZOFRAN-ODT) 8 MG disintegrating tablet Take 1 tablet (8 mg total) by mouth every 8 (eight) hours as needed for nausea. 02/25/20   Emi Belfast, FNP    Allergies Septra [sulfamethoxazole-trimethoprim]  No family history on file.  Social History Social History   Tobacco Use  . Smoking status: Never Smoker  . Smokeless tobacco: Never Used  Vaping Use  . Vaping Use: Never used  Substance Use Topics  . Alcohol use: No  . Drug use: Never    Review of Systems  Constitutional: No fever/chills.  Positive for generalized weakness Eyes: No visual changes. ENT: No sore throat. Cardiovascular: Positive for chest pain. Respiratory: Denies shortness of breath. Gastrointestinal: Positive for left-sided flank and abdominal pain, nausea, vomiting and constipation. No diarrhea.   Genitourinary: Negative for dysuria. Musculoskeletal: Negative for back pain. Skin: Negative for rash. Neurological: Negative for headaches, focal weakness or numbness.  ____________________________________________   PHYSICAL EXAM:  VITAL SIGNS: Vitals:   03/02/20 0900 03/02/20 0930  BP: (!) 141/96 (!) 145/90  Pulse:  Resp: 15 18  Temp:    SpO2:        Constitutional: Alert and oriented.  Uncomfortable-appearing, laying on her right side and holding her left side. Eyes: Conjunctivae are normal. PERRL. EOMI. Head: Atraumatic. Nose: No congestion/rhinnorhea. Mouth/Throat: Mucous  membranes are dry.  Oropharynx non-erythematous. Neck: No stridor. No cervical spine tenderness to palpation. Cardiovascular: Normal rate, regular rhythm. Grossly normal heart sounds.  Good peripheral circulation. Respiratory: Normal respiratory effort.  No retractions. Lungs CTAB. Gastrointestinal: Soft , nondistended. No abdominal bruits. LUQ tenderness to palpation without peritoneal features, otherwise benign and soft abdomen throughout the central, lower and right-sided quadrants. No overlying skin changes or signs of trauma to the left flank, abdomen or back. Musculoskeletal: No lower extremity tenderness nor edema.  No joint effusions. No signs of acute trauma.  No spinal step-offs or signs of trauma to the back.  Some vague left CVA/left flank tenderness. Neurologic:  Normal speech and language. No gross focal neurologic deficits are appreciated. No gait instability noted. Skin:  Skin is warm, dry and intact. No rash noted. Psychiatric: Mood and affect are normal. Speech and behavior are normal.  ____________________________________________   LABS (all labs ordered are listed, but only abnormal results are displayed)  Labs Reviewed  COMPREHENSIVE METABOLIC PANEL - Abnormal; Notable for the following components:      Result Value   Sodium 133 (*)    Chloride 97 (*)    Glucose, Bld 114 (*)    Creatinine, Ser 1.18 (*)    GFR, Estimated 52 (*)    All other components within normal limits  CBC - Abnormal; Notable for the following components:   WBC 10.9 (*)    Hemoglobin 10.9 (*)    HCT 34.3 (*)    MCV 74.4 (*)    MCH 23.6 (*)    All other components within normal limits  FIBRIN DERIVATIVES D-DIMER ([facility] ONLY) - Abnormal; Notable for the following components:   Fibrin derivatives D-dimer ([facility]) 675.42 (*)    All other components within normal limits  LIPASE, BLOOD  LACTIC ACID, PLASMA  URINALYSIS, COMPLETE (UACMP) WITH MICROSCOPIC  TROPONIN I (HIGH SENSITIVITY)  TROPONIN I  (HIGH SENSITIVITY)   ____________________________________________  12 Lead EKG  Rhythm, rate of 78 bpm.  Normal axis.  Normal intervals.  No evidence of acute ischemia. ____________________________________________  RADIOLOGY  ED MD interpretation: 2 view CXR reviewed by me without evidence of acute cardiopulmonary pathology.  Official radiology report(s): DG Chest 2 View  Result Date: 03/02/2020 CLINICAL DATA:  Left flank pain.  Evaluate infiltrates, mass. EXAM: CHEST - 2 VIEW COMPARISON:  CT abdomen/pelvis 02/25/2020. Chest radiographs 02/25/2020. FINDINGS: Heart size within normal limits. No appreciable airspace consolidation or pulmonary edema. No evidence of pleural effusion or pneumothorax. No appreciable thoracic mass. Redemonstrated right lower lobe calcified granuloma. No acute bony abnormality is identified. IMPRESSION: No evidence of active cardiopulmonary disease. Electronically Signed   By: Jackey LogeKyle  Golden DO   On: 03/02/2020 10:16   CT Angio Chest PE W and/or Wo Contrast  Result Date: 03/02/2020 CLINICAL DATA:  Question pulmonary embolism. Left chest and flank pain. Elevated D-dimer. EXAM: CT ANGIOGRAPHY CHEST WITH CONTRAST TECHNIQUE: Multidetector CT imaging of the chest was performed using the standard protocol during bolus administration of intravenous contrast. Multiplanar CT image reconstructions and MIPs were obtained to evaluate the vascular anatomy. CONTRAST:  75mL OMNIPAQUE IOHEXOL 350 MG/ML SOLN COMPARISON:  Chest radiography same day FINDINGS: Cardiovascular: Pulmonary arterial opacification is excellent. No pulmonary  emboli. Heart size is normal. No visible coronary artery calcification. No aortic atherosclerotic calcification. No pericardial fluid. Mediastinum/Nodes: No mass or lymphadenopathy. Lungs/Pleura: Lungs are clear. No infiltrate, collapse or effusion. 3 mm calcified granuloma incidentally noted in the right middle lobe. Incidental 3 mm granuloma in the superior  segment of the right lower lobe. Upper Abdomen: Negative.  See results of abdominal CT. Musculoskeletal: Ordinary mild thoracic degenerative changes. No visible rib fracture or lesion. Review of the MIP images confirms the above findings. IMPRESSION: 1. No pulmonary emboli or other acute chest pathology. 2. Incidental 3 mm calcified granuloma in the right middle lobe and superior segment of the right lower lobe. Electronically Signed   By: Paulina Fusi M.D.   On: 03/02/2020 11:24   CT ABDOMEN PELVIS W CONTRAST  Result Date: 03/02/2020 CLINICAL DATA:  Left abdominal and flank pain. EXAM: CT ABDOMEN AND PELVIS WITH CONTRAST TECHNIQUE: Multidetector CT imaging of the abdomen and pelvis was performed using the standard protocol following bolus administration of intravenous contrast. CONTRAST:  22mL OMNIPAQUE IOHEXOL 350 MG/ML SOLN COMPARISON:  Chest CT same day.  CT abdomen 02/25/2020. FINDINGS: Lower chest: Clear Hepatobiliary: Normal Pancreas: Normal Spleen: Normal Adrenals/Urinary Tract: Adrenal glands are normal. Kidneys are normal. No cyst, mass, stone or hydronephrosis. No suspicion of inflammatory disease on today's study. Bladder is normal. Stomach/Bowel: Stomach and small intestine are normal. Normal amount of gas and fecal matter in the colon. No sign of diverticulosis or diverticulitis. Vascular/Lymphatic: Aortic atherosclerosis. No aneurysm. IVC is normal. No retroperitoneal adenopathy. Reproductive: No pelvic mass.  Incidental retroverted uterus. Other: No free fluid or air. Musculoskeletal: Mild lower lumbar degenerative changes. Chronic unilateral pars defect on the right at L5. No lower thoracic or upper lumbar pathology seen that might explain radiating pain. IMPRESSION: 1. No cause of left flank pain identified. No evidence of urinary tract stone disease or other urinary tract pathology. 2. Aortic atherosclerosis. 3. Chronic unilateral pars defect on the right at L5. No lower thoracic or upper  lumbar pathology seen that might explain radiating pain. Aortic Atherosclerosis (ICD10-I70.0). Electronically Signed   By: Paulina Fusi M.D.   On: 03/02/2020 11:28    ____________________________________________   PROCEDURES and INTERVENTIONS  Procedure(s) performed (including Critical Care):  .1-3 Lead EKG Interpretation Performed by: Delton Prairie, MD Authorized by: Delton Prairie, MD     Interpretation: normal     ECG rate:  80   ECG rate assessment: normal     Rhythm: sinus rhythm     Ectopy: none     Conduction: normal      Medications  lactated ringers bolus 1,000 mL (1,000 mLs Intravenous New Bag/Given 03/02/20 0944)  morphine 4 MG/ML injection 4 mg (4 mg Intravenous Given 03/02/20 0945)  ondansetron (ZOFRAN) injection 4 mg (4 mg Intravenous Given 03/02/20 0945)  iohexol (OMNIPAQUE) 350 MG/ML injection 75 mL (75 mLs Intravenous Contrast Given 03/02/20 1101)    ____________________________________________   MDM / ED COURSE  64 year old woman presents to the ED with chronic left flank pain without evidence of acute pathology, amenable to outpatient management.  Normal vital signs on room air.  Exam demonstrates vague tenderness to the area in question without overlying skin changes, signs of trauma, evidence of shingles.  Blood work is generally reassuring, though does demonstrate a mild elevation of D-dimer.  Due to this and the possibility of acute PE as the source of her symptoms, CTA chest was obtained and does not show evidence of PE or other  acute intrathoracic pathology.  CT abdomen/pelvis with venous phase contrast similarly does not demonstrate evidence of acute pathology as a source of her symptoms.  Work-up is overall quite benign and she is tolerating p.o. intake without complication.  Discussed the uncertainty of diagnosis, but no evidence of acute features.  Discussed follow-up with PCP.  We discussed return precautions for the ED.  Patient medically stable for  discharge home.  Clinical Course as of Mar 02 1254  Tue Mar 02, 2020  1151 Educated patient and son reassuring CTs.  We discussed no evidence of emergent pathology.  We discussed outpatient management and return precautions for the ED.   [DS]  1253 Patient tolerates p.o. intake without complication.   [DS]    Clinical Course User Index [DS] Delton Prairie, MD     ____________________________________________   FINAL CLINICAL IMPRESSION(S) / ED DIAGNOSES  Final diagnoses:  Chronic left flank pain     ED Discharge Orders    None       Tylia Ewell Katrinka Blazing   Note:  This document was prepared using Dragon voice recognition software and may include unintentional dictation errors.   Delton Prairie, MD 03/02/20 1259

## 2020-03-02 NOTE — ED Notes (Signed)
Pt given food and water

## 2020-03-03 ENCOUNTER — Ambulatory Visit
Admission: RE | Admit: 2020-03-03 | Discharge: 2020-03-03 | Disposition: A | Discharge status: Home / Self Care | Payer: Commercial Managed Care - PPO | Source: Ambulatory Visit | Attending: Otolaryngology | Admitting: Otolaryngology

## 2020-03-03 DIAGNOSIS — H90A22 Sensorineural hearing loss, unilateral, left ear, with restricted hearing on the contralateral side: Secondary | ICD-10-CM | POA: Diagnosis present

## 2020-03-03 MED ORDER — GADOBUTROL 1 MMOL/ML IV SOLN
5.0000 mL | Freq: Once | INTRAVENOUS | Status: AC | PRN
  Administered 2020-03-03: 5 mL via INTRAVENOUS

## 2020-03-18 ENCOUNTER — Other Ambulatory Visit: Payer: Self-pay | Admitting: Family Medicine

## 2020-03-18 DIAGNOSIS — R112 Nausea with vomiting, unspecified: Secondary | ICD-10-CM

## 2020-03-18 DIAGNOSIS — R1013 Epigastric pain: Secondary | ICD-10-CM

## 2020-03-23 ENCOUNTER — Other Ambulatory Visit: Payer: Self-pay | Admitting: Family Medicine

## 2020-03-23 DIAGNOSIS — D649 Anemia, unspecified: Secondary | ICD-10-CM

## 2020-03-23 DIAGNOSIS — R7309 Other abnormal glucose: Secondary | ICD-10-CM

## 2020-03-23 DIAGNOSIS — E7841 Elevated Lipoprotein(a): Secondary | ICD-10-CM

## 2020-03-24 ENCOUNTER — Other Ambulatory Visit: Payer: Self-pay

## 2020-03-24 ENCOUNTER — Other Ambulatory Visit (INDEPENDENT_AMBULATORY_CARE_PROVIDER_SITE_OTHER): Payer: Commercial Managed Care - PPO

## 2020-03-24 ENCOUNTER — Telehealth: Payer: Self-pay

## 2020-03-24 DIAGNOSIS — R7309 Other abnormal glucose: Secondary | ICD-10-CM

## 2020-03-24 DIAGNOSIS — E7841 Elevated Lipoprotein(a): Secondary | ICD-10-CM

## 2020-03-24 DIAGNOSIS — D649 Anemia, unspecified: Secondary | ICD-10-CM | POA: Diagnosis not present

## 2020-03-24 LAB — COMPREHENSIVE METABOLIC PANEL
ALT: 14 U/L (ref 0–35)
AST: 17 U/L (ref 0–37)
Albumin: 3.7 g/dL (ref 3.5–5.2)
Alkaline Phosphatase: 64 U/L (ref 39–117)
BUN: 17 mg/dL (ref 6–23)
CO2: 26 mEq/L (ref 19–32)
Calcium: 9.5 mg/dL (ref 8.4–10.5)
Chloride: 106 mEq/L (ref 96–112)
Creatinine, Ser: 0.77 mg/dL (ref 0.40–1.20)
GFR: 82.27 mL/min (ref >60.00)
Glucose, Bld: 89 mg/dL (ref 70–99)
Potassium: 3.5 mEq/L (ref 3.5–5.1)
Sodium: 140 mEq/L (ref 135–145)
Total Bilirubin: 0.5 mg/dL (ref 0.2–1.2)
Total Protein: 6.8 g/dL (ref 6.0–8.3)

## 2020-03-24 LAB — CBC WITH DIFFERENTIAL/PLATELET
Basophils Absolute: 0.1 10*3/uL (ref 0.0–0.1)
Basophils Relative: 0.9 % (ref 0.0–3.0)
Eosinophils Absolute: 0.4 10*3/uL (ref 0.0–0.7)
Eosinophils Relative: 4.2 % (ref 0.0–5.0)
HCT: 32.5 % — ABNORMAL LOW (ref 36.0–46.0)
Hemoglobin: 10.2 g/dL — ABNORMAL LOW (ref 12.0–15.0)
Lymphocytes Relative: 19.2 % (ref 12.0–46.0)
Lymphs Abs: 1.6 10*3/uL (ref 0.7–4.0)
MCHC: 31.2 g/dL (ref 30.0–36.0)
MCV: 76.7 fl — ABNORMAL LOW (ref 78.0–100.0)
Monocytes Absolute: 0.9 10*3/uL (ref 0.1–1.0)
Monocytes Relative: 11 % (ref 3.0–12.0)
Neutro Abs: 5.4 10*3/uL (ref 1.4–7.7)
Neutrophils Relative %: 64.7 % (ref 43.0–77.0)
Platelets: 244 10*3/uL (ref 150.0–400.0)
RBC: 4.24 Mil/uL (ref 3.87–5.11)
RDW: 17.1 % — ABNORMAL HIGH (ref 11.5–15.5)
WBC: 8.4 10*3/uL (ref 4.0–10.5)

## 2020-03-24 LAB — LIPID PANEL
Cholesterol: 195 mg/dL (ref 0–200)
HDL: 53.3 mg/dL (ref >39.00)
LDL Cholesterol: 109 mg/dL — ABNORMAL HIGH (ref 0–99)
NonHDL: 141.55
Total CHOL/HDL Ratio: 4
Triglycerides: 165 mg/dL — ABNORMAL HIGH (ref 0.0–149.0)
VLDL: 33 mg/dL (ref 0.0–40.0)

## 2020-03-24 LAB — VITAMIN B12: Vitamin B-12: 435 pg/mL (ref 211–911)

## 2020-03-24 LAB — HEMOGLOBIN A1C: Hgb A1c MFr Bld: 5.6 % (ref 4.6–6.5)

## 2020-03-24 LAB — FERRITIN: Ferritin: 155.9 ng/mL (ref 10.0–291.0)

## 2020-03-24 NOTE — Telephone Encounter (Signed)
Please call patient and let her know the following- She can call and schedule an appointment for screening mammogram. A referral is not needed.  [phone] [place]- [phone]  [facility]- [phone]

## 2020-03-24 NOTE — Telephone Encounter (Signed)
Called and left detailed voicemail (okay per DPR) with Debbies information.

## 2020-03-24 NOTE — Telephone Encounter (Signed)
Patient is requesting a referral be placed for a mammogram. Could you please advise?

## 2020-03-31 ENCOUNTER — Encounter: Payer: Self-pay | Admitting: Family Medicine

## 2020-03-31 ENCOUNTER — Ambulatory Visit (INDEPENDENT_AMBULATORY_CARE_PROVIDER_SITE_OTHER): Payer: Commercial Managed Care - PPO | Admitting: Family Medicine

## 2020-03-31 ENCOUNTER — Other Ambulatory Visit: Payer: Self-pay

## 2020-03-31 VITALS — BP 98/62 | HR 82 | Temp 97.8°F | Ht 59.0 in | Wt 120.0 lb

## 2020-03-31 DIAGNOSIS — D649 Anemia, unspecified: Secondary | ICD-10-CM

## 2020-03-31 DIAGNOSIS — Z1211 Encounter for screening for malignant neoplasm of colon: Secondary | ICD-10-CM

## 2020-03-31 DIAGNOSIS — Z0000 Encounter for general adult medical examination without abnormal findings: Secondary | ICD-10-CM

## 2020-03-31 DIAGNOSIS — Z23 Encounter for immunization: Secondary | ICD-10-CM

## 2020-03-31 NOTE — Patient Instructions (Addendum)
Please call and schedule an appointment for screening mammogram. A referral is not needed.  [phone] [place]- [phone]  [phone]  You will get a call about screening for colon cancer with colonoscopy

## 2020-03-31 NOTE — Progress Notes (Signed)
Subjective:    Patient ID: Diana Livingston, female    DOB: Dec 13, 1955, 64 y.o.   MRN: [id number]  HPI Chief Complaint  Patient presents with  . Annual Exam    Here with son. Fully Covid vaccinated x 3. Needs flu shot today. Needs mammogram.   This is a 64 yo female who presents today for annual exam. With son, Caryn Bee, who is translating and husband who is also having CPE.     Last CPE- 03/2019 Mammo- never, will schedule Pap- 11/20 Colonoscopy- open to screening, was referred last year but GI was unable to get in touch with her Tdap- unsure Flu- today, annual Covid 19 vaccine- fully vaccinated Eye- last 1-2 years Dental- over due Exercise- walking with husband  Abdominal pain-  She was feeling poorly at the end of October, had questionable pyelonephritis, abdominal pain, weight loss, anorexia. She had several PCP visits as well as ER visit. CT abdomen/ pelvis and CT angio chest performed, no worrisome findings and nothing to explain symptoms. Patient reports that she has improved, no longer having pain, appetite and energy level improved.        Review of Systems  Constitutional: Negative.   HENT: Negative.   Eyes: Negative.   Respiratory: Negative.   Cardiovascular: Negative.   Gastrointestinal: Negative.   Endocrine: Negative.   Genitourinary: Negative.   Musculoskeletal: Negative.   Skin: Negative.   Allergic/Immunologic: Negative.   Neurological: Negative.   Hematological: Negative.   Psychiatric/Behavioral: Negative.        Objective:   Physical Exam Physical Exam  Constitutional: She is oriented to person, place, and time. She appears well-developed and well-nourished. No distress.  HENT:  Head: Normocephalic and atraumatic.  Right Ear: External ear normal. TM normal.  Left Ear: External ear normal. TM normal.  Nose: Nose normal.  Mouth/Throat: Oropharynx is clear and moist. No oropharyngeal exudate.  Eyes: Conjunctivae are normal.   Neck: Normal  range of motion. Neck supple. No JVD present. No thyromegaly present.  Cardiovascular: Normal rate, regular rhythm, normal heart sounds and intact distal pulses.   Pulmonary/Chest: Effort normal and breath sounds normal. Right breast exhibits no inverted nipple, no mass, no nipple discharge, no skin change and no tenderness. Left breast exhibits no inverted nipple, no mass, no nipple discharge, no skin change and no tenderness. Breasts are symmetrical.  Abdominal: Soft. Bowel sounds are normal. She exhibits no distension and no mass. There is no tenderness. There is no rebound and no guarding.  Musculoskeletal: Normal range of motion. She exhibits no edema or tenderness.  Lymphadenopathy:    She has no cervical adenopathy.  Neurological: She is alert and oriented to person, place, and time.   Skin: Skin is warm and dry. She is not diaphoretic.  Psychiatric: She has a normal mood and affect. Her behavior is normal. Judgment and thought content normal.  Vitals reviewed.     BP 98/62 (BP Location: Left Arm, Patient Position: Sitting, Cuff Size: Normal)   Pulse 82   Temp 97.8 F (36.6 C)   Ht 4\' 11"  (1.499 m)   Wt 120 lb (54.4 kg)   SpO2 99%   BMI 24.24 kg/m  Wt Readings from Last 3 Encounters:  03/31/20 120 lb (54.4 kg)  03/02/20 115 lb (52.2 kg)  02/25/20 111 lb (50.3 kg)       Assessment & Plan:  1. Annual physical exam - Discussed and encouraged healthy lifestyle choices- adequate sleep, regular exercise, stress management and healthy  food choices.  - reviewed overdue health maintenance and provided information for her to schedule her mammogram  2. Screening for colon cancer - Ambulatory referral to Gastroenterology  3. Need for influenza vaccination - Flu Vaccine QUAD 36+ mos IM  4. Anemia, unspecified type - mild with normal ferritin, B12. Will have her return in 8 weeks for repeat CBC, TSH, folate  This visit occurred during the SARS-CoV-2 public health emergency.   Safety protocols were in place, including screening questions prior to the visit, additional usage of staff PPE, and extensive cleaning of exam room while observing appropriate contact time as indicated for disinfecting solutions.      Olean Ree, FNP-BC  [facility] at [facility], [facility]  04/01/2020 9:49 PM

## 2020-04-01 ENCOUNTER — Encounter: Payer: Self-pay | Admitting: Family Medicine

## 2020-04-06 ENCOUNTER — Other Ambulatory Visit: Payer: Self-pay | Admitting: Family Medicine

## 2020-04-06 DIAGNOSIS — Z1231 Encounter for screening mammogram for malignant neoplasm of breast: Secondary | ICD-10-CM

## 2020-04-15 ENCOUNTER — Other Ambulatory Visit: Payer: Self-pay | Admitting: Family Medicine

## 2020-04-15 DIAGNOSIS — R1013 Epigastric pain: Secondary | ICD-10-CM

## 2020-04-15 DIAGNOSIS — R112 Nausea with vomiting, unspecified: Secondary | ICD-10-CM

## 2020-04-29 ENCOUNTER — Other Ambulatory Visit: Payer: Self-pay

## 2020-04-29 DIAGNOSIS — R112 Nausea with vomiting, unspecified: Secondary | ICD-10-CM

## 2020-04-29 DIAGNOSIS — R1013 Epigastric pain: Secondary | ICD-10-CM

## 2020-04-29 MED ORDER — OMEPRAZOLE 20 MG PO CPDR: DELAYED_RELEASE_CAPSULE | ORAL | 0 refills | Status: DC

## 2020-05-25 ENCOUNTER — Ambulatory Visit: Payer: Commercial Managed Care - PPO

## 2021-06-19 IMAGING — CT CT ABD-PELV W/ CM
2 of 5 series · 16 of 46 positions shown, 18 images · IV contrast (APPLIED)
Comparison: Chest CT same day.  CT abdomen 02/25/2020.

CLINICAL DATA: Left abdominal and flank pain.

EXAM:
CT ABDOMEN AND PELVIS WITH CONTRAST
TECHNIQUE: Multidetector CT imaging of the abdomen and pelvis was performed
using the standard protocol following bolus administration of
intravenous contrast.
CONTRAST:  75mL OMNIPAQUE IOHEXOL 350 MG/ML SOLN

[Series 2: axial st · axial · 0.67mm/px · z∈[-586,-226]mm · 13 of 81 slices shown, 15 images]
[im 5/81  soft-tissue]
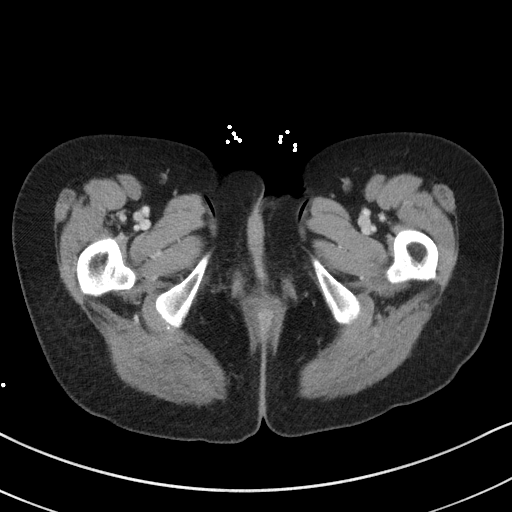
[im 5/81  bone]
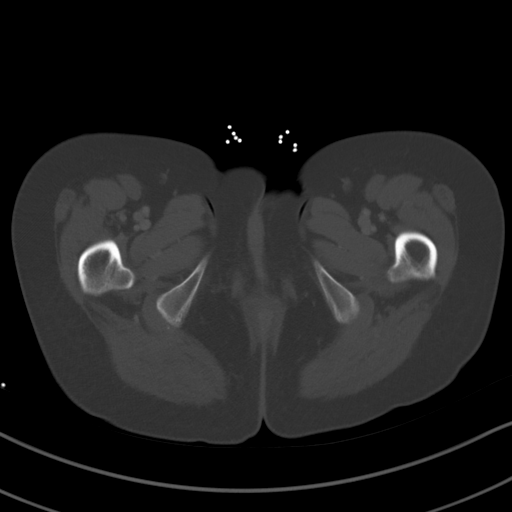
[im 13/81  soft-tissue]
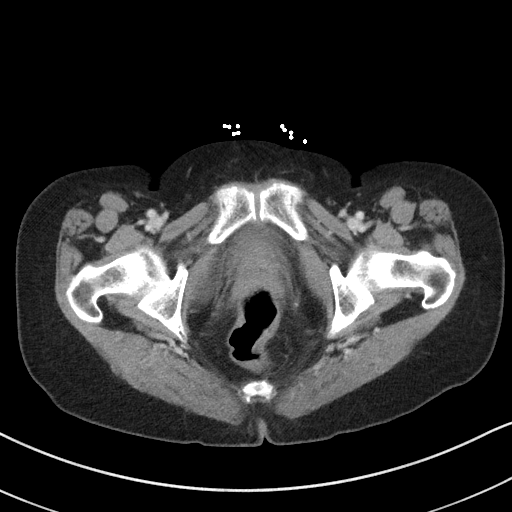
[im 17/81  soft-tissue]
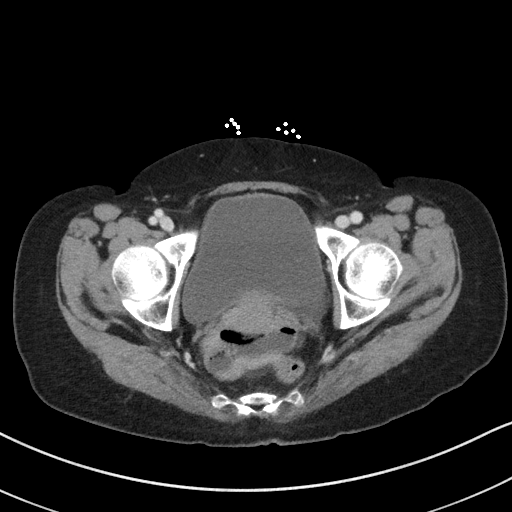
[im 25/81  soft-tissue]
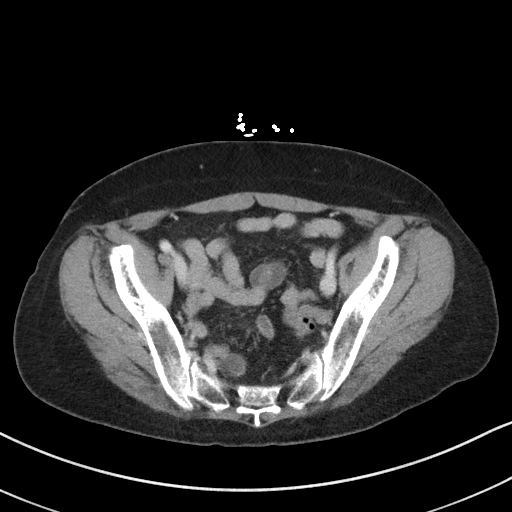
[im 29/81  soft-tissue]
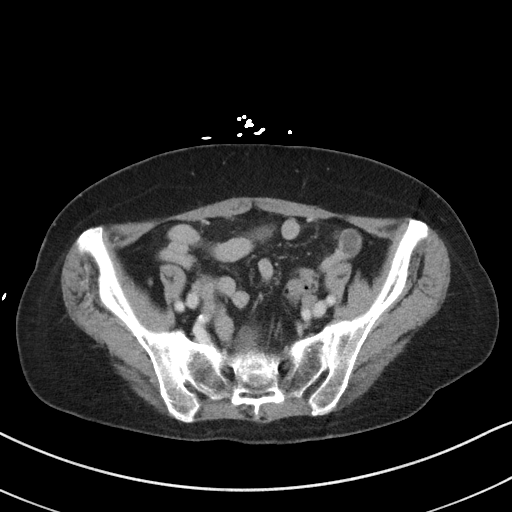
[im 37/81  soft-tissue]
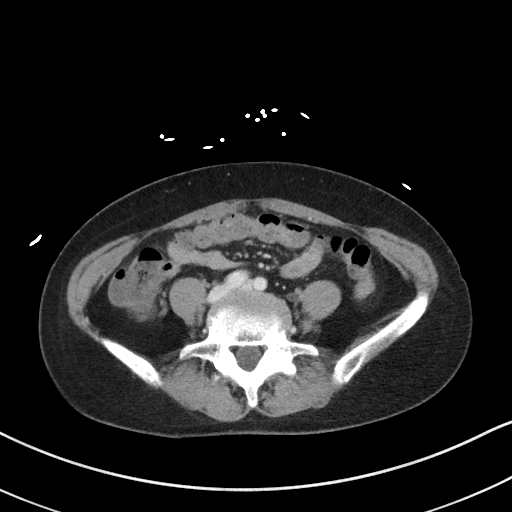
[im 41/81  soft-tissue]
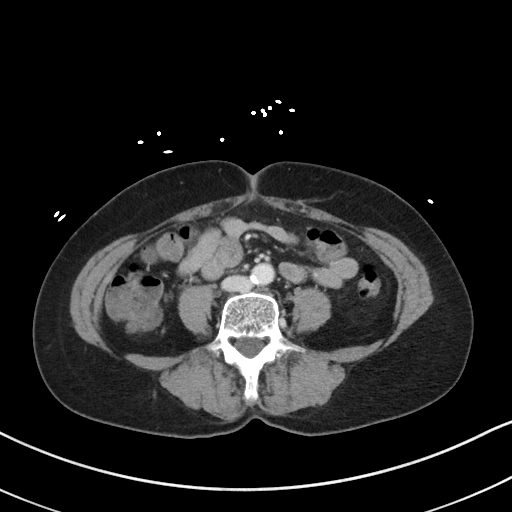
[im 45/81  soft-tissue]
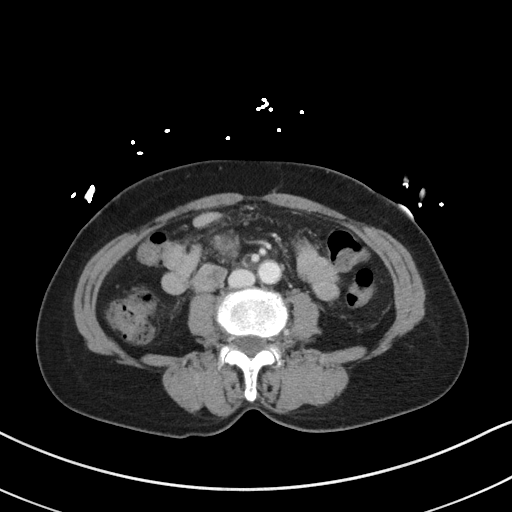
[im 53/81  soft-tissue]
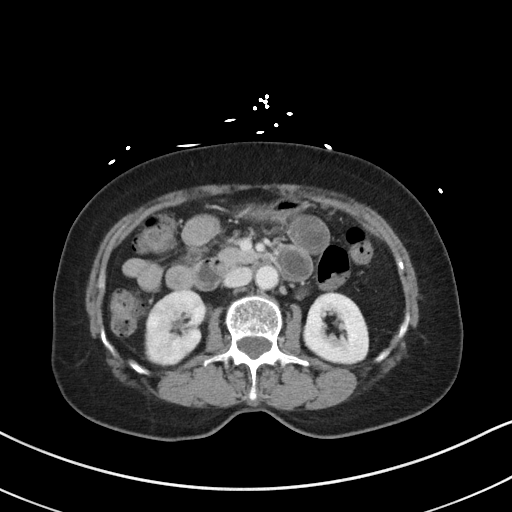
[im 53/81  bone]
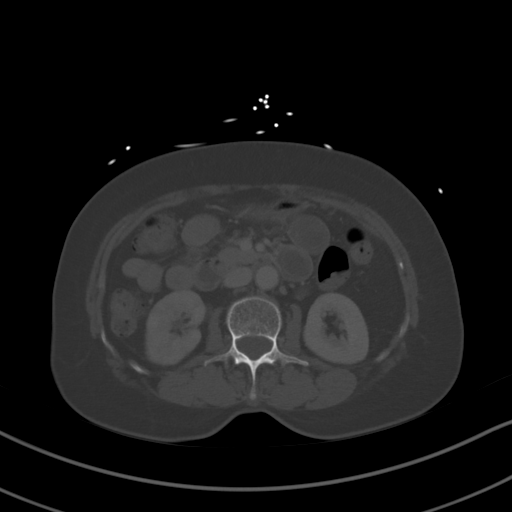
[im 57/81  soft-tissue]
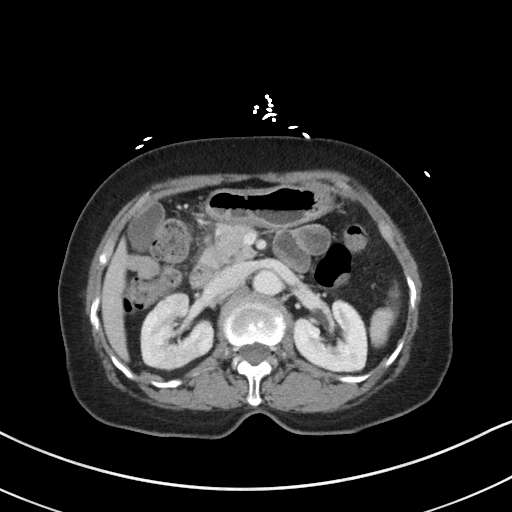
[im 65/81  soft-tissue]
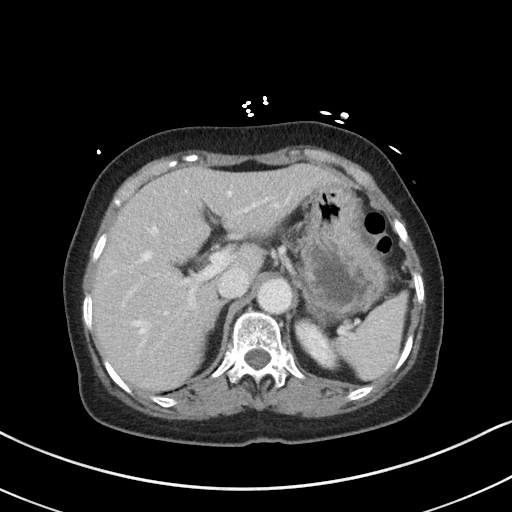
[im 69/81  soft-tissue]
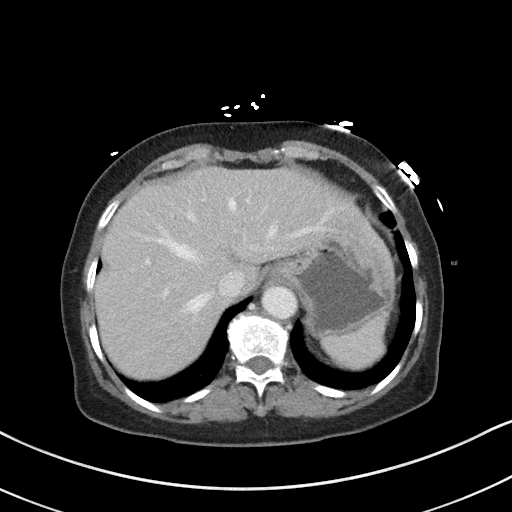
[im 77/81  soft-tissue]
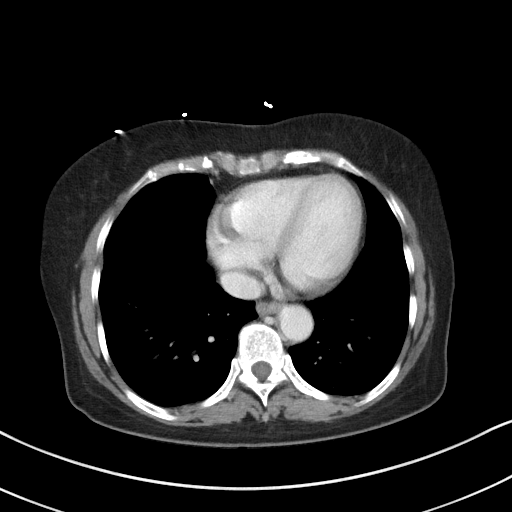

[Series 5: coronal st · coronal · 0.63mm/px · 3 of 77 slices shown]
[im 26/77  soft-tissue]
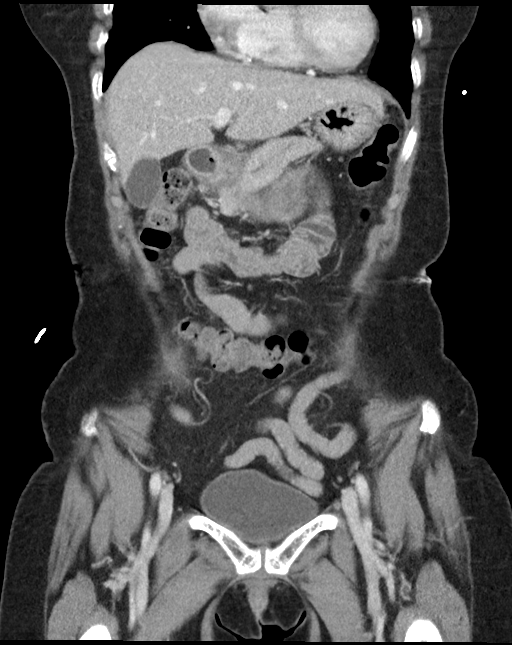
[im 34/77  soft-tissue]
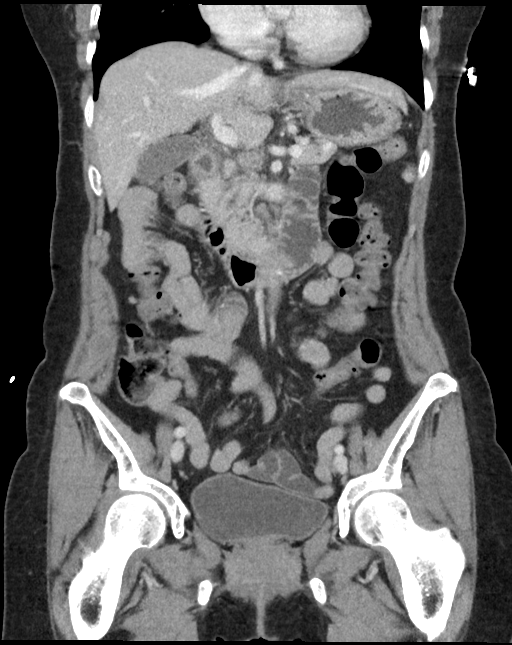
[im 43/77  soft-tissue]
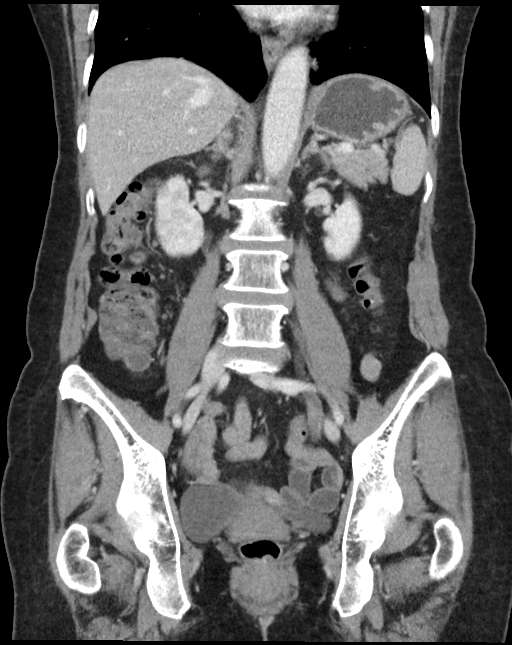

[16 of 46 positions shown; findings below may reference images not displayed]

FINDINGS: Lower chest: Clear

Hepatobiliary: Normal

Pancreas: Normal

Spleen: Normal

Adrenals/Urinary Tract: Adrenal glands are normal. Kidneys are
normal. No cyst, mass, stone or hydronephrosis. No suspicion of
inflammatory disease on today's study. Bladder is normal.

Stomach/Bowel: Stomach and small intestine are normal. Normal amount
of gas and fecal matter in the colon. No sign of diverticulosis or
diverticulitis.

Vascular/Lymphatic: Aortic atherosclerosis. No aneurysm. IVC is
normal. No retroperitoneal adenopathy.

Reproductive: No pelvic mass.  Incidental retroverted uterus.

Other: No free fluid or air.

Musculoskeletal: Mild lower lumbar degenerative changes. Chronic
unilateral pars defect on the right at L5. No lower thoracic or
upper lumbar pathology seen that might explain radiating pain.
IMPRESSION: 1. No cause of left flank pain identified. No evidence of urinary
tract stone disease or other urinary tract pathology.
2. Aortic atherosclerosis.
3. Chronic unilateral pars defect on the right at L5. No lower
thoracic or upper lumbar pathology seen that might explain radiating
pain.

Aortic Atherosclerosis ([id number].Y).

## 2022-02-02 ENCOUNTER — Ambulatory Visit (INDEPENDENT_AMBULATORY_CARE_PROVIDER_SITE_OTHER): Payer: Self-pay | Admitting: Family Medicine

## 2022-02-02 ENCOUNTER — Encounter: Payer: Self-pay | Admitting: Family Medicine

## 2022-02-02 VITALS — BP 122/70 | HR 75 | Temp 97.9°F | Wt 115.5 lb

## 2022-02-02 DIAGNOSIS — H5789 Other specified disorders of eye and adnexa: Secondary | ICD-10-CM

## 2022-02-02 DIAGNOSIS — Z23 Encounter for immunization: Secondary | ICD-10-CM

## 2022-02-02 MED ORDER — ERYTHROMYCIN 5 MG/GM OP OINT: 1.0000 | TOPICAL_OINTMENT | Freq: Three times a day (TID) | OPHTHALMIC | 0 refills | Status: DC

## 2022-02-02 NOTE — Progress Notes (Signed)
1 week with R eye redness.  Some discharge.  No vision loss.  Not itchy.  R eye only.  No L eye sx.  No trauma.  No pain.  Feels well o/w.  No runny nose, no ST.    Meds, vitals, and allergies reviewed.   ROS: Per HPI unless specifically indicated in ROS section   Nad Ncat TM wnl nasal and OP exam wnl Neck supple, no LA PERRL EOMI, limited nondilated fundus exam unremarkable bilaterally. No foreign body seen. Inferior medial R eye irritation/erythema with neg staining.  No other erythema on the conjunctiva on the right or left eye. Lids unremarkable bilaterally.

## 2022-02-02 NOTE — Patient Instructions (Signed)
Use erythromycin ointment 3 times a day and update [place] as needed.  Take care.  Glad to see you.

## 2022-02-05 DIAGNOSIS — H5789 Other specified disorders of eye and adnexa: Secondary | ICD-10-CM | POA: Insufficient documentation

## 2022-02-05 NOTE — Assessment & Plan Note (Signed)
No foreign body seen and no abnormal stain uptake.  Discussed options.  Presume conjunctivitis, limited. Use erythromycin ointment 3 times a day and update [place] as needed.  She agrees with plan.

## 2022-03-16 ENCOUNTER — Encounter: Payer: No Typology Code available for payment source | Admitting: Nurse Practitioner

## 2022-07-01 ENCOUNTER — Ambulatory Visit
Admission: EM | Admit: 2022-07-01 | Discharge: 2022-07-01 | Disposition: A | Discharge status: Home / Self Care | Payer: No Typology Code available for payment source | Attending: Urgent Care | Admitting: Urgent Care

## 2022-07-01 ENCOUNTER — Encounter: Payer: Self-pay | Admitting: Emergency Medicine

## 2022-07-01 DIAGNOSIS — R052 Subacute cough: Secondary | ICD-10-CM | POA: Diagnosis not present

## 2022-07-01 DIAGNOSIS — K219 Gastro-esophageal reflux disease without esophagitis: Secondary | ICD-10-CM

## 2022-07-01 MED ORDER — OMEPRAZOLE 40 MG PO CPDR: 40.0000 mg | DELAYED_RELEASE_CAPSULE | Freq: Every day | ORAL | 0 refills | Status: DC

## 2022-07-01 MED ORDER — PROMETHAZINE-DM 6.25-15 MG/5ML PO SYRP: 5.0000 mL | ORAL_SOLUTION | Freq: Every day | ORAL | 0 refills | Status: DC

## 2022-07-01 NOTE — ED Provider Notes (Signed)
Roderic Palau    CSN: [id number] Arrival date & time: 07/01/22  1216      History   Chief Complaint Chief Complaint  Patient presents with   Cough    HPI Diana Livingston is a 67 y.o. female.    Cough   Accompanied by her son.  Presents with complaint of cough x 1 month.  Taking over-the-counter medication for symptom control.  Denies fever, chills, body aches.  Denies nausea, vomiting, diarrhea.  Denies upper respiratory symptoms.  Only mild nasal congestion.  No facial pressure or symptoms of sinusitis.  Is worse at night.  Generally does not bother her during the day.  She endorses acid indigestion, with symptoms worse at night.  Epigastric pain.  History reviewed. No pertinent past medical history.  Patient Active Problem List   Diagnosis Date Noted   Red eye 02/05/2022   Elevated random blood glucose level 03/23/2020   Elevated lipoprotein(a) 03/23/2020    Past Surgical History:  Procedure Laterality Date   NO PAST SURGERIES      OB History   No obstetric history on file.      Home Medications    Prior to Admission medications   Medication Sig Start Date End Date Taking? Authorizing Provider  erythromycin ophthalmic ointment Place 1 Application into the right eye 3 (three) times daily. 02/02/22   Tonia Ghent, MD  Multiple Vitamins-Minerals (CENTRUM SILVER 50+WOMEN) TABS Take 1 tablet by mouth daily.    [provider]    Family History History reviewed. No pertinent family history.  Social History Social History   Tobacco Use   Smoking status: Never   Smokeless tobacco: Never  Vaping Use   Vaping Use: Never used  Substance Use Topics   Alcohol use: No   Drug use: Never     Allergies   Septra [sulfamethoxazole-trimethoprim]   Review of Systems Review of Systems  Respiratory:  Positive for cough.      Physical Exam Triage Vital Signs ED Triage Vitals  Enc Vitals Group     BP 07/01/22 1330 137/77     Pulse  Rate 07/01/22 1330 66     Resp 07/01/22 1330 16     Temp 07/01/22 1330 98.5 F (36.9 C)     Temp Source 07/01/22 1330 Oral     SpO2 07/01/22 1330 99 %     Weight --      Height --      Head Circumference --      Peak Flow --      Pain Score 07/01/22 1333 0     Pain Loc --      Pain Edu? --      Excl. in [place]? --    No data found.  Updated Vital Signs BP 137/77 (BP Location: Left Arm)   Pulse 66   Temp 98.5 F (36.9 C) (Oral)   Resp 16   SpO2 99%   Visual Acuity Right Eye Distance:   Left Eye Distance:   Bilateral Distance:    Right Eye Near:   Left Eye Near:    Bilateral Near:     Physical Exam Vitals reviewed.  Constitutional:      Appearance: Normal appearance.  Cardiovascular:     Rate and Rhythm: Normal rate and regular rhythm.     Pulses: Normal pulses.     Heart sounds: Normal heart sounds.  Pulmonary:     Effort: Pulmonary effort is normal.  Breath sounds: Normal breath sounds.  Abdominal:     General: Abdomen is flat.     Tenderness: There is abdominal tenderness in the epigastric area.  Skin:    General: Skin is warm and dry.  Neurological:     General: No focal deficit present.     Mental Status: She is alert and oriented to person, place, and time.  Psychiatric:        Mood and Affect: Mood normal.        Behavior: Behavior normal.      UC Treatments / Results  Labs (all labs ordered are listed, but only abnormal results are displayed) Labs Reviewed - No data to display  EKG   Radiology No results found.  Procedures Procedures (including critical care time)  Medications Ordered in UC Medications - No data to display  Initial Impression / Assessment and Plan / UC Course  I have reviewed the triage vital signs and the nursing notes.  Pertinent labs & imaging results that were available during my care of the patient were reviewed by me and considered in my medical decision making (see chart for details).   Patient is afebrile  here without recent antipyretics. Satting well on room air. Overall is well appearing, well hydrated, without respiratory distress. Pulmonary exam is unremarkable.  Lungs CTAB without wheezing, rhonchi, rales.  Post viral cough syndrome versus GERD.  Will treat with omeprazole 40 mg x 3 months and ask her to follow-up with her primary care provider for additional refills and evaluation if necessary.  Will also provide nighttime cough medicine to give her some short-term relief.   Final Clinical Impressions(s) / UC Diagnoses   Final diagnoses:  None   Discharge Instructions   None    ED Prescriptions   None    PDMP not reviewed this encounter.   Rose Phi, [place] 07/01/22 1354

## 2022-07-01 NOTE — Discharge Instructions (Addendum)
The description of your symptoms makes me suspicious for cough as a result of acid indigestion.  I have prescribed an acid reducer which I recommend taking for at least 6 weeks before stopping.  I have prescribed enough for 3 months.  Please follow-up with your primary care provider regarding additional refills or evaluation as needed.  If your symptoms improve with this treatment, I would suggest evaluation of your esophagus with an endoscopy procedure.  I have also prescribed a cough medication to be used at nighttime to help relieve your symptoms in the short-term.  This medication will likely cause sedation.  You can also use an over-the-counter medication called dextromethorphan which is a cough suppressant.

## 2022-07-01 NOTE — ED Triage Notes (Signed)
Cough with nasal congestion over the last month. Taking mucinex, dayquil, and nyquil without resolution of symptoms. Denies fever, chills, SOB, CP, abdominal pain, N/V/D At home covid test negative

## 2023-07-24 ENCOUNTER — Ambulatory Visit (INDEPENDENT_AMBULATORY_CARE_PROVIDER_SITE_OTHER): Admitting: Family Medicine

## 2023-07-24 ENCOUNTER — Encounter: Payer: Self-pay | Admitting: Family Medicine

## 2023-07-24 VITALS — BP 106/60 | HR 79 | Temp 98.7°F | Ht 59.5 in | Wt 113.0 lb

## 2023-07-24 DIAGNOSIS — R300 Dysuria: Secondary | ICD-10-CM | POA: Diagnosis not present

## 2023-07-24 MED ORDER — CEPHALEXIN 500 MG PO CAPS: 500.0000 mg | ORAL_CAPSULE | Freq: Three times a day (TID) | ORAL | 0 refills | Status: AC

## 2023-07-24 NOTE — Patient Instructions (Addendum)
 You likely had a viral infection that caused chills, sweats and vomiting. That appears to be better.    You could have a separate problem with a urinary infection.  I would like to check your urine today.  Please go to the lab.   Drink enough water so your urine is clear or light yellow.  If you have more pain with urination, then start the antibiotics.   If the burning gets better in the meantime, you do not need to start the antibiotic.   Take care.  Glad to see you.

## 2023-07-24 NOTE — Progress Notes (Unsigned)
 Sx started about 5 days ago.  Initially with chills and then sweats.  Then vomited, but that resolved in the meantime.  She had symptoms that are similar to others with recent flulike symptoms in the community.  Vomiting and aches resolved in the meantime.  No blood in stools.  Noted a change in stool odor with recent illness.  Overall she was improving until she noted burning with urination.  No fevers now.  Meds, vitals, and allergies reviewed.   ROS: Per HPI unless specifically indicated in ROS section   Nad Ncat Neck supple no LA Rrr Ctab Abd soft, not ttp Skin well-perfused. No CVA pain.

## 2023-07-25 DIAGNOSIS — R300 Dysuria: Secondary | ICD-10-CM | POA: Insufficient documentation

## 2023-07-25 LAB — URINALYSIS, ROUTINE W REFLEX MICROSCOPIC
Bilirubin Urine: NEGATIVE
Hgb urine dipstick: NEGATIVE
Ketones, ur: NEGATIVE
Leukocytes,Ua: NEGATIVE
Nitrite: NEGATIVE
RBC / HPF: NONE SEEN (ref 0–?)
Specific Gravity, Urine: 1.01 (ref 1.000–1.030)
Total Protein, Urine: NEGATIVE
Urine Glucose: NEGATIVE
Urobilinogen, UA: 0.2 (ref 0.0–1.0)
pH: 6 (ref 5.0–8.0)

## 2023-07-25 NOTE — Assessment & Plan Note (Signed)
 She likely had a self resolving benign viral illness.  She could have a UTI in the meantime.  Check urinalysis and urine culture.  If she has progressive symptoms then start Keflex.  Drink plenty of water in the meantime.  If her symptoms improved she would not get have to start Keflex.

## 2023-07-27 ENCOUNTER — Telehealth: Payer: Self-pay | Admitting: Family Medicine

## 2023-07-27 LAB — URINE CULTURE
MICRO NUMBER:: 16244783
SPECIMEN QUALITY:: ADEQUATE

## 2023-07-27 NOTE — Telephone Encounter (Signed)
 Copied from CRM [id number]. Topic: Clinical - Lab/Test Results >> Jul 27, 2023 12:01 PM Almira Coaster wrote: Reason for CRM: Patient's daughter is returning a call she received, Advised of Dr.Duncan message and patient has started the antibiotics, they will return the call at the end of the course to advise how the patient is feeling.

## 2023-07-27 NOTE — Telephone Encounter (Signed)
 Added information to results notes.  No further action needed at this time.

## 2024-05-22 NOTE — Progress Notes (Signed)
 Diana Livingston                                          MRN: [id number]   05/22/2024   The [facility] Quality Team Specialist reviewed this patient medical record for the purposes of chart review for care gap closure. The following were reviewed: chart review for care gap closure-colorectal cancer screening.    [facility] Quality Team
